# Patient Record
Sex: Male | Born: 1945 | Race: White | Hispanic: No | Marital: Married | Smoking: Current some day smoker
Health system: Southern US, Community
[De-identification: ages and names within clinical notes are randomized; demographics above are authoritative.]

## PROBLEM LIST (undated history)

## (undated) DIAGNOSIS — I998 Other disorder of circulatory system: Secondary | ICD-10-CM

## (undated) DIAGNOSIS — I2510 Atherosclerotic heart disease of native coronary artery without angina pectoris: Secondary | ICD-10-CM

## (undated) DIAGNOSIS — E7800 Pure hypercholesterolemia, unspecified: Secondary | ICD-10-CM

## (undated) DIAGNOSIS — E119 Type 2 diabetes mellitus without complications: Secondary | ICD-10-CM

## (undated) DIAGNOSIS — G540 Brachial plexus disorders: Secondary | ICD-10-CM

## (undated) DIAGNOSIS — D126 Benign neoplasm of colon, unspecified: Secondary | ICD-10-CM

## (undated) DIAGNOSIS — R918 Other nonspecific abnormal finding of lung field: Secondary | ICD-10-CM

## (undated) DIAGNOSIS — I252 Old myocardial infarction: Secondary | ICD-10-CM

## (undated) DIAGNOSIS — G2581 Restless legs syndrome: Secondary | ICD-10-CM

## (undated) DIAGNOSIS — D099 Carcinoma in situ, unspecified: Secondary | ICD-10-CM

## (undated) DIAGNOSIS — I10 Essential (primary) hypertension: Secondary | ICD-10-CM

## (undated) DIAGNOSIS — E785 Hyperlipidemia, unspecified: Secondary | ICD-10-CM

## (undated) DIAGNOSIS — K259 Gastric ulcer, unspecified as acute or chronic, without hemorrhage or perforation: Secondary | ICD-10-CM

## (undated) DIAGNOSIS — C61 Malignant neoplasm of prostate: Secondary | ICD-10-CM

## (undated) DIAGNOSIS — I219 Acute myocardial infarction, unspecified: Secondary | ICD-10-CM

## (undated) DIAGNOSIS — I700 Atherosclerosis of aorta: Secondary | ICD-10-CM

## (undated) DIAGNOSIS — J8410 Pulmonary fibrosis, unspecified: Secondary | ICD-10-CM

## (undated) DIAGNOSIS — K635 Polyp of colon: Secondary | ICD-10-CM

## (undated) DIAGNOSIS — F17200 Nicotine dependence, unspecified, uncomplicated: Secondary | ICD-10-CM

## (undated) DIAGNOSIS — J449 Chronic obstructive pulmonary disease, unspecified: Secondary | ICD-10-CM

## (undated) DIAGNOSIS — Z87891 Personal history of nicotine dependence: Secondary | ICD-10-CM

## (undated) DIAGNOSIS — Z9289 Personal history of other medical treatment: Secondary | ICD-10-CM

## (undated) DIAGNOSIS — J309 Allergic rhinitis, unspecified: Secondary | ICD-10-CM

## (undated) DIAGNOSIS — I6523 Occlusion and stenosis of bilateral carotid arteries: Secondary | ICD-10-CM

## (undated) DIAGNOSIS — R972 Elevated prostate specific antigen [PSA]: Secondary | ICD-10-CM

## (undated) DIAGNOSIS — N320 Bladder-neck obstruction: Secondary | ICD-10-CM

## (undated) DIAGNOSIS — I6529 Occlusion and stenosis of unspecified carotid artery: Secondary | ICD-10-CM

## (undated) DIAGNOSIS — C689 Malignant neoplasm of urinary organ, unspecified: Secondary | ICD-10-CM

## (undated) HISTORY — DX: Malignant neoplasm of urinary organ, unspecified: C68.9

## (undated) HISTORY — DX: Personal history of other medical treatment: Z92.89

## (undated) HISTORY — DX: Atherosclerosis of aorta: I70.0

## (undated) HISTORY — DX: Hyperlipidemia, unspecified: E78.5

## (undated) HISTORY — PX: CORONARY ANGIOPLASTY: SHX604

## (undated) HISTORY — DX: Personal history of nicotine dependence: Z87.891

## (undated) HISTORY — PX: CAROTID ENDARTERECTOMY: SUR193

## (undated) HISTORY — DX: Other nonspecific abnormal finding of lung field: R91.8

---
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
## 1997-12-20 HISTORY — PX: CORONARY ANGIOPLASTY: SHX604

## 2006-06-27 ENCOUNTER — Emergency Department: Payer: Self-pay | Admitting: Emergency Medicine

## 2007-11-22 ENCOUNTER — Ambulatory Visit: Payer: Self-pay | Admitting: Unknown Physician Specialty

## 2008-04-01 ENCOUNTER — Ambulatory Visit: Payer: Self-pay | Admitting: Unknown Physician Specialty

## 2008-08-21 ENCOUNTER — Ambulatory Visit: Payer: Self-pay | Admitting: Surgery

## 2008-09-03 ENCOUNTER — Ambulatory Visit: Payer: Self-pay | Admitting: Surgery

## 2008-11-01 ENCOUNTER — Ambulatory Visit: Payer: Self-pay | Admitting: Vascular Surgery

## 2008-11-05 ENCOUNTER — Inpatient Hospital Stay: Payer: Self-pay | Admitting: Vascular Surgery

## 2010-05-07 ENCOUNTER — Ambulatory Visit: Payer: Self-pay | Admitting: Family Medicine

## 2012-12-20 HISTORY — PX: CATARACT EXTRACTION: SUR2

## 2012-12-20 HISTORY — PX: COLONOSCOPY: SHX174

## 2013-05-07 ENCOUNTER — Ambulatory Visit: Payer: Self-pay | Admitting: Unknown Physician Specialty

## 2013-05-09 LAB — PATHOLOGY REPORT

## 2013-11-07 ENCOUNTER — Ambulatory Visit: Payer: Self-pay | Admitting: Ophthalmology

## 2013-11-07 DIAGNOSIS — I10 Essential (primary) hypertension: Secondary | ICD-10-CM

## 2013-11-19 ENCOUNTER — Ambulatory Visit: Payer: Self-pay | Admitting: Ophthalmology

## 2014-03-11 ENCOUNTER — Ambulatory Visit: Payer: Self-pay | Admitting: Ophthalmology

## 2014-06-03 DIAGNOSIS — R972 Elevated prostate specific antigen [PSA]: Secondary | ICD-10-CM | POA: Insufficient documentation

## 2014-06-03 DIAGNOSIS — F17200 Nicotine dependence, unspecified, uncomplicated: Secondary | ICD-10-CM | POA: Insufficient documentation

## 2014-06-03 DIAGNOSIS — E785 Hyperlipidemia, unspecified: Secondary | ICD-10-CM | POA: Insufficient documentation

## 2014-06-03 DIAGNOSIS — I2510 Atherosclerotic heart disease of native coronary artery without angina pectoris: Secondary | ICD-10-CM | POA: Insufficient documentation

## 2014-09-09 DIAGNOSIS — E119 Type 2 diabetes mellitus without complications: Secondary | ICD-10-CM | POA: Insufficient documentation

## 2015-02-17 DIAGNOSIS — E785 Hyperlipidemia, unspecified: Secondary | ICD-10-CM | POA: Diagnosis not present

## 2015-02-17 DIAGNOSIS — I6529 Occlusion and stenosis of unspecified carotid artery: Secondary | ICD-10-CM | POA: Diagnosis not present

## 2015-02-17 DIAGNOSIS — I10 Essential (primary) hypertension: Secondary | ICD-10-CM | POA: Diagnosis not present

## 2015-02-17 DIAGNOSIS — M1990 Unspecified osteoarthritis, unspecified site: Secondary | ICD-10-CM | POA: Diagnosis not present

## 2015-03-03 DIAGNOSIS — E119 Type 2 diabetes mellitus without complications: Secondary | ICD-10-CM | POA: Diagnosis not present

## 2015-03-03 DIAGNOSIS — Z79899 Other long term (current) drug therapy: Secondary | ICD-10-CM | POA: Diagnosis not present

## 2015-03-03 DIAGNOSIS — E785 Hyperlipidemia, unspecified: Secondary | ICD-10-CM | POA: Diagnosis not present

## 2015-03-03 DIAGNOSIS — Z125 Encounter for screening for malignant neoplasm of prostate: Secondary | ICD-10-CM | POA: Diagnosis not present

## 2015-03-10 DIAGNOSIS — I10 Essential (primary) hypertension: Secondary | ICD-10-CM | POA: Diagnosis not present

## 2015-03-10 DIAGNOSIS — E780 Pure hypercholesterolemia: Secondary | ICD-10-CM | POA: Diagnosis not present

## 2015-03-10 DIAGNOSIS — E119 Type 2 diabetes mellitus without complications: Secondary | ICD-10-CM | POA: Diagnosis not present

## 2015-03-10 DIAGNOSIS — I2510 Atherosclerotic heart disease of native coronary artery without angina pectoris: Secondary | ICD-10-CM | POA: Diagnosis not present

## 2015-03-11 DIAGNOSIS — H47022 Hemorrhage in optic nerve sheath, left eye: Secondary | ICD-10-CM | POA: Diagnosis not present

## 2015-03-11 DIAGNOSIS — I2510 Atherosclerotic heart disease of native coronary artery without angina pectoris: Secondary | ICD-10-CM | POA: Diagnosis not present

## 2015-03-13 DIAGNOSIS — I214 Non-ST elevation (NSTEMI) myocardial infarction: Secondary | ICD-10-CM | POA: Diagnosis not present

## 2015-03-13 DIAGNOSIS — I10 Essential (primary) hypertension: Secondary | ICD-10-CM | POA: Diagnosis not present

## 2015-03-13 DIAGNOSIS — E119 Type 2 diabetes mellitus without complications: Secondary | ICD-10-CM | POA: Diagnosis not present

## 2015-03-13 DIAGNOSIS — I2510 Atherosclerotic heart disease of native coronary artery without angina pectoris: Secondary | ICD-10-CM | POA: Diagnosis not present

## 2015-04-11 NOTE — Op Note (Signed)
PATIENT NAME:  Seth Rivera, Seth Rivera MR#:  [id number] DATE OF BIRTH:  [birth year]  DATE OF PROCEDURE:  11/19/2013  PREOPERATIVE DIAGNOSIS:  Cataract, left eye.    POSTOPERATIVE DIAGNOSIS:  Cataract, left eye.  PROCEDURE PERFORMED:  Extracapsular cataract extraction using phacoemulsification with placement of an Alcon SN6CWF, 24.0-diopter posterior chamber lens, serial # [id number].  SURGEON:  Loura Back. Haniya Fern, MD  ASSISTANT:  None.  ANESTHESIA:  Was 4% lidocaine and 0.75% Marcaine in a 50:50 mixture 10 units/mL of Hylenex added, given as a peribulbar.   ANESTHESIOLOGIST: Dr. Boston Service.   COMPLICATIONS:  None.  ESTIMATED BLOOD LOSS:  Less than 1 ml.  DESCRIPTION OF PROCEDURE:  The patient was brought to the operating room and given a peribulbar block.  The patient was then prepped and draped in the usual fashion.  The vertical rectus muscles were imbricated using 5-0 silk sutures.  These sutures were then clamped to the sterile drapes as bridle sutures.  A limbal peritomy was performed extending two clock hours and hemostasis was obtained with cautery.  A partial thickness scleral groove was made at the surgical limbus and dissected anteriorly in a lamellar dissection using an Alcon crescent knife.  The anterior chamber was entered supero-temporally with a Superblade and through the lamellar dissection with a 2.6 mm keratome.  DisCoVisc was used to replace the aqueous and a continuous tear capsulorrhexis was carried out.  Hydrodissection and hydrodelineation were carried out with balanced salt and a 27 gauge canula.  The nucleus was rotated to confirm the effectiveness of the hydrodissection.  Phacoemulsification was carried out using a divide-and-conquer technique.  Total ultrasound time was 1 minute and 15 seconds with an average power of 23%. CDE 29.95. No sutures placed.   Irrigation/aspiration was used to remove the residual cortex.  DisCoVisc was used to inflate the capsule and the  internal incision was enlarged to 3 mm with the crescent knife.  The intraocular lens was folded and inserted into the capsular bag using the An AcrySert delivery system was used.  Irrigation/aspiration was used to remove the residual DisCoVisc.  Miostat was injected into the anterior chamber through the paracentesis track to inflate the anterior chamber and induce miosis.  The wound was checked for leaks and none were found. The conjunctiva was closed with cautery and the bridle sutures were removed.  Two drops of 0.3% Vigamox were placed on the eye.   An eye shield was placed on the eye.  No injectable antibiotics were used in this case.   The patient was discharged to the recovery room in good condition.    ____________________________ Loura Back Merrell Borsuk, MD sad:np D: 11/19/2013 13:29:48 ET T: 11/19/2013 15:54:49 ET JOB#: [id number]  cc: Remo Lipps A. Deiondra Denley, MD, <Dictator> Martie Lee MD ELECTRONICALLY SIGNED 11/26/2013 8:34

## 2015-04-12 NOTE — Op Note (Signed)
PATIENT NAME:  Seth Rivera, Seth Rivera MR#:  [id number] DATE OF BIRTH:  1946/12/15  DATE OF PROCEDURE:  03/11/2014  PREOPERATIVE DIAGNOSIS: Cataract, right eye.   POSTOPERATIVE DIAGNOSIS: Cataract, right eye.   PROCEDURE PERFORMED: Extracapsular cataract extraction using phacoemulsification with placement of Alcon SN6CWS, 23.5-diopter posterior chamber lens, serial number [id number].   SURGEON: Loura Back. Elwyn Lowden, MD   ANESTHESIA: 4% lidocaine and 0.75% Marcaine in a 50-50 mixture with 10 units/mL of Hylenex added, given as a peribulbar.   ANESTHESIOLOGIST: Gjibertus F. Boston Service, MD  COMPLICATIONS: None.   ESTIMATED BLOOD LOSS: Less than 1 mL.   DESCRIPTION OF PROCEDURE:  The patient was brought to the operating room and given a peribulbar block.  The patient was then prepped and draped in the usual fashion.  The vertical rectus muscles were imbricated using 5-0 silk sutures.  These sutures were then clamped to the sterile drapes as bridle sutures.  A limbal peritomy was performed extending two clock hours and hemostasis was obtained with cautery.  A partial thickness scleral groove was made at the surgical limbus and dissected anteriorly in a lamellar dissection using an Alcon crescent knife.  The anterior chamber was entered superonasally with a Superblade and through the lamellar dissection with a 2.6 mm keratome.  DisCoVisc was used to replace the aqueous and a continuous tear capsulorrhexis was carried out.  Hydrodissection and hydrodelineation were carried out with balanced salt and a 27 gauge canula.  The nucleus was rotated to confirm the effectiveness of the hydrodissection.  Phacoemulsification was carried out using a divide-and-conquer technique.  Total ultrasound time was 59.8 seconds with an average power of 18.5%, CDE of 18.41.   Irrigation/aspiration was used to remove the residual cortex.  DisCoVisc was used to inflate the capsule and the internal incision was enlarged to 3 mm  with the crescent knife.  The intraocular lens was folded and inserted into the capsular bag using the Alcon AcrySert delivery system.  Irrigation/aspiration was used to remove the residual DisCoVisc.  Miostat was injected into the anterior chamber through the paracentesis track to inflate the anterior chamber and induce miosis.  The wound was checked for leaks and none were found. The conjunctiva was closed with cautery and the bridle sutures were removed.  Two drops of 0.3% Vigamox were placed on the eye.   An eye shield was placed on the eye.  The patient was discharged to the recovery room in good condition.  NOTE: No intraocular antibiotics were used due to patient allergies.   ____________________________ Loura Back Mikinzie Maciejewski, MD sad:jcm D: 03/11/2014 13:50:52 ET T: 03/11/2014 16:46:43 ET JOB#: [id number]  cc: Remo Lipps A. Thanvi Blincoe, MD, <Dictator> Martie Lee MD ELECTRONICALLY SIGNED 03/18/2014 13:28

## 2015-04-24 DIAGNOSIS — J439 Emphysema, unspecified: Secondary | ICD-10-CM | POA: Diagnosis not present

## 2015-04-24 DIAGNOSIS — G2581 Restless legs syndrome: Secondary | ICD-10-CM | POA: Diagnosis not present

## 2015-09-08 DIAGNOSIS — I10 Essential (primary) hypertension: Secondary | ICD-10-CM | POA: Diagnosis not present

## 2015-09-08 DIAGNOSIS — E780 Pure hypercholesterolemia: Secondary | ICD-10-CM | POA: Diagnosis not present

## 2015-09-08 DIAGNOSIS — I2510 Atherosclerotic heart disease of native coronary artery without angina pectoris: Secondary | ICD-10-CM | POA: Diagnosis not present

## 2015-09-08 DIAGNOSIS — I214 Non-ST elevation (NSTEMI) myocardial infarction: Secondary | ICD-10-CM | POA: Diagnosis not present

## 2015-11-27 DIAGNOSIS — J432 Centrilobular emphysema: Secondary | ICD-10-CM | POA: Diagnosis not present

## 2016-02-19 DIAGNOSIS — I6529 Occlusion and stenosis of unspecified carotid artery: Secondary | ICD-10-CM | POA: Diagnosis not present

## 2016-02-19 DIAGNOSIS — I2510 Atherosclerotic heart disease of native coronary artery without angina pectoris: Secondary | ICD-10-CM | POA: Diagnosis not present

## 2016-02-19 DIAGNOSIS — I6523 Occlusion and stenosis of bilateral carotid arteries: Secondary | ICD-10-CM | POA: Diagnosis not present

## 2016-02-19 DIAGNOSIS — E785 Hyperlipidemia, unspecified: Secondary | ICD-10-CM | POA: Diagnosis not present

## 2016-02-19 DIAGNOSIS — M1990 Unspecified osteoarthritis, unspecified site: Secondary | ICD-10-CM | POA: Diagnosis not present

## 2016-02-19 DIAGNOSIS — I10 Essential (primary) hypertension: Secondary | ICD-10-CM | POA: Diagnosis not present

## 2016-03-09 DIAGNOSIS — J410 Simple chronic bronchitis: Secondary | ICD-10-CM | POA: Diagnosis not present

## 2016-03-09 DIAGNOSIS — I10 Essential (primary) hypertension: Secondary | ICD-10-CM | POA: Diagnosis not present

## 2016-03-09 DIAGNOSIS — E7800 Pure hypercholesterolemia, unspecified: Secondary | ICD-10-CM | POA: Diagnosis not present

## 2016-03-09 DIAGNOSIS — E119 Type 2 diabetes mellitus without complications: Secondary | ICD-10-CM | POA: Diagnosis not present

## 2016-03-09 DIAGNOSIS — I2510 Atherosclerotic heart disease of native coronary artery without angina pectoris: Secondary | ICD-10-CM | POA: Diagnosis not present

## 2016-04-21 DIAGNOSIS — R7309 Other abnormal glucose: Secondary | ICD-10-CM | POA: Diagnosis not present

## 2016-04-21 DIAGNOSIS — I259 Chronic ischemic heart disease, unspecified: Secondary | ICD-10-CM | POA: Diagnosis not present

## 2016-04-21 DIAGNOSIS — I10 Essential (primary) hypertension: Secondary | ICD-10-CM | POA: Diagnosis not present

## 2016-04-21 DIAGNOSIS — E785 Hyperlipidemia, unspecified: Secondary | ICD-10-CM | POA: Diagnosis not present

## 2016-04-23 DIAGNOSIS — I259 Chronic ischemic heart disease, unspecified: Secondary | ICD-10-CM | POA: Diagnosis not present

## 2016-04-23 DIAGNOSIS — E785 Hyperlipidemia, unspecified: Secondary | ICD-10-CM | POA: Diagnosis not present

## 2016-04-23 DIAGNOSIS — I10 Essential (primary) hypertension: Secondary | ICD-10-CM | POA: Diagnosis not present

## 2016-05-13 ENCOUNTER — Telehealth: Payer: Self-pay | Admitting: *Deleted

## 2016-05-13 NOTE — Telephone Encounter (Signed)
Received referral for initial lung cancer screening scan. Contacted patient and obtained smoking history as well as answering questions related to screening process. Patient is tentatively scheduled for shared decision making visit and CT scan on 05/21/16 at 2pm, pending insurance approval from business office.

## 2016-05-19 ENCOUNTER — Telehealth: Payer: Self-pay | Admitting: *Deleted

## 2016-05-19 NOTE — Telephone Encounter (Signed)
Received referral for initial lung cancer screening scan. Contacted patient and obtained smoking history, (former smoker, quit 01/21/2116, 50 pack year history),as well as answering questions related to screening process. Patient denies symptoms of lung cancer such as hemoptysis or weight loss and reports he is willing and able to undergo curative treatment for lung cancer if found. Patient is tentatively scheduled for shared decision making visit and CT scan on 05/21/16, pending insurance approval from business office.

## 2016-05-20 ENCOUNTER — Other Ambulatory Visit: Payer: Self-pay | Admitting: Family Medicine

## 2016-05-20 ENCOUNTER — Telehealth: Payer: Self-pay | Admitting: *Deleted

## 2016-05-20 ENCOUNTER — Encounter: Payer: Self-pay | Admitting: Family Medicine

## 2016-05-20 DIAGNOSIS — Z87891 Personal history of nicotine dependence: Secondary | ICD-10-CM

## 2016-05-20 HISTORY — DX: Personal history of nicotine dependence: Z87.891

## 2016-05-20 NOTE — Telephone Encounter (Signed)
Patient reports that he does not want lung cancer screening scan at this time. He would like to wait until after his scheduled pulmonology appointment.

## 2016-05-21 ENCOUNTER — Inpatient Hospital Stay: Payer: Commercial Managed Care - HMO | Admitting: Family Medicine

## 2016-05-21 ENCOUNTER — Ambulatory Visit: Payer: Commercial Managed Care - HMO

## 2016-07-09 DIAGNOSIS — D126 Benign neoplasm of colon, unspecified: Secondary | ICD-10-CM | POA: Diagnosis not present

## 2016-07-13 ENCOUNTER — Telehealth: Payer: Self-pay | Admitting: *Deleted

## 2016-07-13 NOTE — Telephone Encounter (Signed)
Received referral for low dose CT lung cancer screening. Despite multiple attempts at all contact numbers available, have not been able to arrange for shared decision making visit and CT scan. Letter mailed to patient in final attempt to contact patient. I will be happy to assist in the future if patient so desires. Will forward to referring provider.  

## 2016-07-27 ENCOUNTER — Other Ambulatory Visit: Payer: Self-pay | Admitting: Specialist

## 2016-07-27 DIAGNOSIS — R0602 Shortness of breath: Secondary | ICD-10-CM

## 2016-07-27 DIAGNOSIS — J439 Emphysema, unspecified: Secondary | ICD-10-CM | POA: Diagnosis not present

## 2016-07-30 ENCOUNTER — Ambulatory Visit: Admission: RE | Admit: 2016-07-30 | Payer: Commercial Managed Care - HMO | Source: Ambulatory Visit

## 2016-08-03 ENCOUNTER — Ambulatory Visit
Admission: RE | Admit: 2016-08-03 | Discharge: 2016-08-03 | Disposition: A | Discharge status: Home / Self Care | Payer: Commercial Managed Care - HMO | Source: Ambulatory Visit | Attending: Specialist | Admitting: Specialist

## 2016-08-03 DIAGNOSIS — I2510 Atherosclerotic heart disease of native coronary artery without angina pectoris: Secondary | ICD-10-CM | POA: Insufficient documentation

## 2016-08-03 DIAGNOSIS — I700 Atherosclerosis of aorta: Secondary | ICD-10-CM | POA: Diagnosis not present

## 2016-08-03 DIAGNOSIS — R0602 Shortness of breath: Secondary | ICD-10-CM | POA: Diagnosis present

## 2016-08-03 DIAGNOSIS — J439 Emphysema, unspecified: Secondary | ICD-10-CM | POA: Diagnosis not present

## 2016-08-20 DIAGNOSIS — I10 Essential (primary) hypertension: Secondary | ICD-10-CM | POA: Diagnosis not present

## 2016-08-20 DIAGNOSIS — E785 Hyperlipidemia, unspecified: Secondary | ICD-10-CM | POA: Diagnosis not present

## 2016-08-30 DIAGNOSIS — R7309 Other abnormal glucose: Secondary | ICD-10-CM | POA: Diagnosis not present

## 2016-08-30 DIAGNOSIS — E785 Hyperlipidemia, unspecified: Secondary | ICD-10-CM | POA: Diagnosis not present

## 2016-08-30 DIAGNOSIS — I10 Essential (primary) hypertension: Secondary | ICD-10-CM | POA: Diagnosis not present

## 2016-09-03 ENCOUNTER — Encounter: Payer: Self-pay | Admitting: *Deleted

## 2016-09-06 ENCOUNTER — Ambulatory Visit: Payer: Commercial Managed Care - HMO | Admitting: Anesthesiology

## 2016-09-06 ENCOUNTER — Ambulatory Visit
Admission: RE | Admit: 2016-09-06 | Discharge: 2016-09-06 | Disposition: A | Discharge status: Home / Self Care | Payer: Commercial Managed Care - HMO | Source: Ambulatory Visit | Attending: Unknown Physician Specialty | Admitting: Unknown Physician Specialty

## 2016-09-06 ENCOUNTER — Encounter
Admission: RE | Disposition: A | Discharge status: Home / Self Care | Payer: Self-pay | Source: Ambulatory Visit | Attending: Unknown Physician Specialty

## 2016-09-06 DIAGNOSIS — I2510 Atherosclerotic heart disease of native coronary artery without angina pectoris: Secondary | ICD-10-CM | POA: Diagnosis not present

## 2016-09-06 DIAGNOSIS — J8410 Pulmonary fibrosis, unspecified: Secondary | ICD-10-CM | POA: Insufficient documentation

## 2016-09-06 DIAGNOSIS — I6529 Occlusion and stenosis of unspecified carotid artery: Secondary | ICD-10-CM | POA: Insufficient documentation

## 2016-09-06 DIAGNOSIS — G2581 Restless legs syndrome: Secondary | ICD-10-CM | POA: Insufficient documentation

## 2016-09-06 DIAGNOSIS — K635 Polyp of colon: Secondary | ICD-10-CM | POA: Diagnosis not present

## 2016-09-06 DIAGNOSIS — E785 Hyperlipidemia, unspecified: Secondary | ICD-10-CM | POA: Diagnosis not present

## 2016-09-06 DIAGNOSIS — D124 Benign neoplasm of descending colon: Secondary | ICD-10-CM | POA: Diagnosis not present

## 2016-09-06 DIAGNOSIS — K5790 Diverticulosis of intestine, part unspecified, without perforation or abscess without bleeding: Secondary | ICD-10-CM | POA: Diagnosis not present

## 2016-09-06 DIAGNOSIS — Z87891 Personal history of nicotine dependence: Secondary | ICD-10-CM | POA: Diagnosis not present

## 2016-09-06 DIAGNOSIS — Z859 Personal history of malignant neoplasm, unspecified: Secondary | ICD-10-CM | POA: Diagnosis not present

## 2016-09-06 DIAGNOSIS — Z7982 Long term (current) use of aspirin: Secondary | ICD-10-CM | POA: Insufficient documentation

## 2016-09-06 DIAGNOSIS — J449 Chronic obstructive pulmonary disease, unspecified: Secondary | ICD-10-CM | POA: Insufficient documentation

## 2016-09-06 DIAGNOSIS — Z86010 Personal history of colonic polyps: Secondary | ICD-10-CM | POA: Diagnosis not present

## 2016-09-06 DIAGNOSIS — I10 Essential (primary) hypertension: Secondary | ICD-10-CM | POA: Diagnosis not present

## 2016-09-06 DIAGNOSIS — Z79899 Other long term (current) drug therapy: Secondary | ICD-10-CM | POA: Insufficient documentation

## 2016-09-06 DIAGNOSIS — I252 Old myocardial infarction: Secondary | ICD-10-CM | POA: Insufficient documentation

## 2016-09-06 DIAGNOSIS — D123 Benign neoplasm of transverse colon: Secondary | ICD-10-CM | POA: Diagnosis not present

## 2016-09-06 DIAGNOSIS — E119 Type 2 diabetes mellitus without complications: Secondary | ICD-10-CM | POA: Diagnosis not present

## 2016-09-06 DIAGNOSIS — Z1211 Encounter for screening for malignant neoplasm of colon: Secondary | ICD-10-CM | POA: Diagnosis not present

## 2016-09-06 HISTORY — DX: Essential (primary) hypertension: I10

## 2016-09-06 HISTORY — DX: Acute myocardial infarction, unspecified: I21.9

## 2016-09-06 HISTORY — DX: Benign neoplasm of colon, unspecified: D12.6

## 2016-09-06 HISTORY — DX: Pulmonary fibrosis, unspecified: J84.10

## 2016-09-06 HISTORY — DX: Chronic obstructive pulmonary disease, unspecified: J44.9

## 2016-09-06 HISTORY — DX: Atherosclerotic heart disease of native coronary artery without angina pectoris: I25.10

## 2016-09-06 HISTORY — DX: Hyperlipidemia, unspecified: E78.5

## 2016-09-06 HISTORY — DX: Type 2 diabetes mellitus without complications: E11.9

## 2016-09-06 HISTORY — DX: Restless legs syndrome: G25.81

## 2016-09-06 HISTORY — PX: COLONOSCOPY WITH PROPOFOL: SHX5780

## 2016-09-06 HISTORY — DX: Occlusion and stenosis of unspecified carotid artery: I65.29

## 2016-09-06 LAB — GLUCOSE, CAPILLARY: GLUCOSE-CAPILLARY: 113 mg/dL — AB (ref 65–99)

## 2016-09-06 SURGERY — COLONOSCOPY WITH PROPOFOL
Anesthesia: General

## 2016-09-06 MED ORDER — LIDOCAINE HCL (PF) 2 % IJ SOLN
INTRAMUSCULAR | Status: DC | PRN
  Administered 2016-09-06: 50 mg

## 2016-09-06 MED ORDER — PROPOFOL 10 MG/ML IV BOLUS
INTRAVENOUS | Status: DC | PRN
  Administered 2016-09-06: 30 mg via INTRAVENOUS
  Administered 2016-09-06: 20 mg via INTRAVENOUS

## 2016-09-06 MED ORDER — MIDAZOLAM HCL 5 MG/5ML IJ SOLN
INTRAMUSCULAR | Status: DC | PRN
  Administered 2016-09-06: 1 mg via INTRAVENOUS

## 2016-09-06 MED ORDER — PROPOFOL 500 MG/50ML IV EMUL
INTRAVENOUS | Status: DC | PRN
  Administered 2016-09-06: 100 ug/kg/min via INTRAVENOUS

## 2016-09-06 MED ORDER — PHENYLEPHRINE HCL 10 MG/ML IJ SOLN
INTRAMUSCULAR | Status: DC | PRN
  Administered 2016-09-06 (×3): 100 ug via INTRAVENOUS

## 2016-09-06 MED ORDER — SODIUM CHLORIDE 0.9 % IV SOLN
INTRAVENOUS | Status: DC
  Administered 2016-09-06: 09:00:00 via INTRAVENOUS

## 2016-09-06 MED ORDER — FENTANYL CITRATE (PF) 100 MCG/2ML IJ SOLN
INTRAMUSCULAR | Status: DC | PRN
  Administered 2016-09-06: 50 ug via INTRAVENOUS

## 2016-09-06 MED ORDER — SODIUM CHLORIDE 0.9 % IV SOLN: INTRAVENOUS | Status: DC

## 2016-09-06 NOTE — H&P (Signed)
   Primary Care Physician:  Albina Billet, MD Primary Gastroenterologist:  Dr. Vira Agar  Pre-Procedure History & Physical: HPI:  Seth Rivera is a 70 y.o. male is here for an colonoscopy.   Past Medical History:  Diagnosis Date  . Carotid stenosis   . COPD (chronic obstructive pulmonary disease) ([place])   . Coronary artery disease   . Diabetes mellitus without complication ([place])   . Hyperlipidemia   . Hypertension   . Myocardial infarction ([place])   . Personal history of tobacco use, presenting hazards to health 05/20/2016  . 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 Restless leg syndrome   . Squamous cell carcinoma ([place])   . Tubular adenoma of colon     Past Surgical History:  Procedure Laterality Date  . CAROTID ENDARTERECTOMY    . CORONARY ANGIOPLASTY    . CORONARY ANGIOPLASTY      Prior to Admission medications   Medication Sig Start Date End Date Taking? Authorizing Provider  aspirin EC 81 MG tablet Take 81 mg by mouth daily.   Yes Historical Provider, MD  atenolol (TENORMIN) 50 MG tablet Take 50 mg by mouth daily.   Yes Historical Provider, MD  atorvastatin (LIPITOR) 40 MG tablet Take 40 mg by mouth daily.   Yes Historical Provider, MD  vitamin B-12 (CYANOCOBALAMIN) 100 MCG tablet Take 100 mcg by mouth daily.   Yes Historical Provider, MD    Allergies as of 07/29/2016  . (Not on File)    History reviewed. No pertinent family history.  Social History   Social History  . Marital status: Married    Spouse name: N/A  . Number of children: N/A  . Years of education: N/A   Occupational History  . Not on file.   Social History Main Topics  . Smoking status: Former Research scientist (life sciences)  . Smokeless tobacco: Never Used  . Alcohol use Not on file  . Drug use: Unknown  . Sexual activity: Not on file   Other Topics Concern  . Not on file   Social History Narrative  . No narrative on file    Review of Systems: See HPI, otherwise negative ROS  Physical Exam: BP (!) 158/95   Pulse 71    Temp 97.6 F (36.4 C) (Tympanic)   Resp (!) 75   Ht 5\' 10"  (1.778 m)   Wt 88.5 kg (195 lb)   SpO2 100%   BMI 27.98 kg/m  General:   Alert,  pleasant and cooperative in NAD Head:  Normocephalic and atraumatic. Neck:  Supple; no masses or thyromegaly. Lungs:  Clear throughout to auscultation.    Heart:  Regular rate and rhythm. Abdomen:  Soft, nontender and nondistended. Normal bowel sounds, without guarding, and without rebound.   Neurologic:  Alert and  oriented x4;  grossly normal neurologically.  Impression/Plan: DISHON GLYMPH is here for an colonoscopy to be performed for personal history of colon polyps.  Risks, benefits, limitations, and alternatives regarding  colonoscopy have been reviewed with the patient.  Questions have been answered.  All parties agreeable.   Gaylyn Cheers, MD  09/06/2016, 9:29 AM

## 2016-09-06 NOTE — Op Note (Signed)
[facility] Patient Name: Seth Rivera Procedure Date: 09/06/2016 9:32 AM MRN: [id number] Account #: [id number] Date of Birth: 11/19/1946 Admit Type: Outpatient Age: 70 Room: [facility] ENDO ROOM 4 Gender: Male Note Status: Finalized Procedure:            Colonoscopy Indications:          High risk colon cancer surveillance: Personal history                        of colonic polyps Providers:            Manya Silvas, MD Referring MD:         Leona Carry. Hall Busing, MD (Referring MD) Medicines:            Propofol per Anesthesia Complications:        No immediate complications. Procedure:            Pre-Anesthesia Assessment:                       - After reviewing the risks and benefits, the patient                        was deemed in satisfactory condition to undergo the                        procedure.                       After obtaining informed consent, the colonoscope was                        passed under direct vision. Throughout the procedure,                        the patient's blood pressure, pulse, and oxygen                        saturations were monitored continuously. The                        Colonoscope was introduced through the anus and                        advanced to the the cecum, identified by appendiceal                        orifice and ileocecal valve. The colonoscopy was                        performed without difficulty. The patient tolerated the                        procedure well. The quality of the bowel preparation                        was excellent. Findings:      Four sessile polyps were found in the splenic flexure and transverse       colon. The polyps were diminutive in size. These polyps were removed       with a jumbo cold forceps. Resection and retrieval were complete.  A small polyp was found in the descending colon. The polyp was sessile.       The polyp was removed with a hot snare. Resection  and retrieval were       complete.      The exam was otherwise without abnormality. Impression:           - Four diminutive polyps at the splenic flexure and in                        the transverse colon, removed with a jumbo cold                        forceps. Resected and retrieved.                       - One small polyp in the descending colon, removed with                        a hot snare. Resected and retrieved.                       - The examination was otherwise normal. Recommendation:       - Await pathology results. Manya Silvas, MD 09/06/2016 10:07:28 AM This report has been signed electronically. Number of Addenda: 0 Note Initiated On: 09/06/2016 9:32 AM Scope Withdrawal Time: 0 hours 22 minutes 36 seconds  Total Procedure Duration: 0 hours 27 minutes 4 seconds       [facility]

## 2016-09-06 NOTE — Transfer of Care (Signed)
Immediate Anesthesia Transfer of Care Note  Patient: Seth Rivera  Procedure(s) Performed: Procedure(s): COLONOSCOPY WITH PROPOFOL (N/A)  Patient Location: PACU  Anesthesia Type:General  Level of Consciousness: sedated  Airway & Oxygen Therapy: Patient Spontanous Breathing and Patient connected to nasal cannula oxygen  Post-op Assessment: Report given to RN and Post -op Vital signs reviewed and stable  Post vital signs: Reviewed and stable  Last Vitals:  Vitals:   09/06/16 0909 09/06/16 1011  BP: (!) 158/95 105/63  Pulse: 71 63  Resp: (!) 75 17  Temp: 36.4 C (!) 35.8 C    Last Pain:  Vitals:   09/06/16 1011  TempSrc: Tympanic         Complications: No apparent anesthesia complications

## 2016-09-06 NOTE — Anesthesia Postprocedure Evaluation (Signed)
Anesthesia Post Note  Patient: Seth Rivera  Procedure(s) Performed: Procedure(s) (LRB): COLONOSCOPY WITH PROPOFOL (N/A)  Patient location during evaluation: PACU Anesthesia Type: General Level of consciousness: awake Pain management: pain level controlled Vital Signs Assessment: post-procedure vital signs reviewed and stable Respiratory status: patient connected to nasal cannula oxygen and spontaneous breathing Cardiovascular status: stable Anesthetic complications: no    Last Vitals:  Vitals:   09/06/16 1021 09/06/16 1031  BP: (!) 142/67 (!) 162/79  Pulse: 60 64  Resp: 14 15  Temp:      Last Pain:  Vitals:   09/06/16 1011  TempSrc: Tympanic                 VAN STAVEREN,Larie Mathes

## 2016-09-06 NOTE — Anesthesia Preprocedure Evaluation (Signed)
Anesthesia Evaluation  Patient identified by MRN, date of birth, ID band Patient awake    Reviewed: Allergy & Precautions, NPO status , Patient's Chart, lab work & pertinent test results  Airway Mallampati: II       Dental  (+) Teeth Intact   Pulmonary COPD, former smoker,     + decreased breath sounds      Cardiovascular Exercise Tolerance: Good hypertension, Pt. on home beta blockers + CAD, + Past MI and + Peripheral Vascular Disease   Rhythm:Regular Rate:Normal     Neuro/Psych negative neurological ROS     GI/Hepatic negative GI ROS, Neg liver ROS,   Endo/Other  diabetes, Well Controlled, Type 2  Renal/GU negative Renal ROS     Musculoskeletal   Abdominal (+) + obese,   Peds negative pediatric ROS (+)  Hematology negative hematology ROS (+)   Anesthesia Other Findings   Reproductive/Obstetrics                             Anesthesia Physical Anesthesia Plan  ASA: III  Anesthesia Plan: General   Post-op Pain Management:    Induction: Intravenous  Airway Management Planned: Natural Airway and Nasal Cannula  Additional Equipment:   Intra-op Plan:   Post-operative Plan:   Informed Consent: I have reviewed the patients History and Physical, chart, labs and discussed the procedure including the risks, benefits and alternatives for the proposed anesthesia with the patient or authorized representative who has indicated his/her understanding and acceptance.     Plan Discussed with: CRNA  Anesthesia Plan Comments:         Anesthesia Quick Evaluation

## 2016-09-07 ENCOUNTER — Encounter: Payer: Self-pay | Admitting: Unknown Physician Specialty

## 2016-09-07 LAB — SURGICAL PATHOLOGY

## 2016-09-23 DIAGNOSIS — I10 Essential (primary) hypertension: Secondary | ICD-10-CM | POA: Diagnosis not present

## 2016-09-23 DIAGNOSIS — I2510 Atherosclerotic heart disease of native coronary artery without angina pectoris: Secondary | ICD-10-CM | POA: Diagnosis not present

## 2016-09-23 DIAGNOSIS — J410 Simple chronic bronchitis: Secondary | ICD-10-CM | POA: Diagnosis not present

## 2016-09-23 DIAGNOSIS — F17200 Nicotine dependence, unspecified, uncomplicated: Secondary | ICD-10-CM | POA: Diagnosis not present

## 2016-09-23 DIAGNOSIS — I214 Non-ST elevation (NSTEMI) myocardial infarction: Secondary | ICD-10-CM | POA: Diagnosis not present

## 2016-09-23 DIAGNOSIS — E7800 Pure hypercholesterolemia, unspecified: Secondary | ICD-10-CM | POA: Diagnosis not present

## 2017-02-07 DIAGNOSIS — J439 Emphysema, unspecified: Secondary | ICD-10-CM | POA: Diagnosis not present

## 2017-02-07 DIAGNOSIS — J449 Chronic obstructive pulmonary disease, unspecified: Secondary | ICD-10-CM | POA: Diagnosis not present

## 2017-02-24 ENCOUNTER — Encounter (INDEPENDENT_AMBULATORY_CARE_PROVIDER_SITE_OTHER): Payer: Self-pay

## 2017-02-24 ENCOUNTER — Ambulatory Visit (INDEPENDENT_AMBULATORY_CARE_PROVIDER_SITE_OTHER): Payer: Medicare HMO | Admitting: Vascular Surgery

## 2017-02-24 ENCOUNTER — Encounter (INDEPENDENT_AMBULATORY_CARE_PROVIDER_SITE_OTHER): Payer: Self-pay | Admitting: Vascular Surgery

## 2017-02-24 ENCOUNTER — Other Ambulatory Visit (INDEPENDENT_AMBULATORY_CARE_PROVIDER_SITE_OTHER): Payer: Self-pay | Admitting: Vascular Surgery

## 2017-02-24 ENCOUNTER — Other Ambulatory Visit (INDEPENDENT_AMBULATORY_CARE_PROVIDER_SITE_OTHER): Payer: Medicare HMO

## 2017-02-24 DIAGNOSIS — I10 Essential (primary) hypertension: Secondary | ICD-10-CM

## 2017-02-24 DIAGNOSIS — E7800 Pure hypercholesterolemia, unspecified: Secondary | ICD-10-CM | POA: Insufficient documentation

## 2017-02-24 DIAGNOSIS — I6529 Occlusion and stenosis of unspecified carotid artery: Secondary | ICD-10-CM | POA: Insufficient documentation

## 2017-02-24 DIAGNOSIS — I6523 Occlusion and stenosis of bilateral carotid arteries: Secondary | ICD-10-CM

## 2017-02-24 NOTE — Progress Notes (Signed)
MRN : [id number]  Seth Rivera is a 71 y.o. (1946/01/28) male who presents with chief complaint of  Chief Complaint  Patient presents with  . Follow-up  .  History of Present Illness: The patient is seen for follow up evaluation of carotid stenosis. The carotid stenosis followed by ultrasound.   The patient denies amaurosis fugax. There is no recent history of TIA symptoms or focal motor deficits. There is no prior documented CVA.  The patient is taking enteric-coated aspirin 81 mg daily.  There is no history of migraine headaches. There is no history of seizures.  The patient has a history of coronary artery disease, no recent episodes of angina or shortness of breath. The patient denies PAD or claudication symptoms. There is a history of hyperlipidemia which is being treated with a statin.    Carotid Duplex done today shows right patent CEA and left ICA 40-59%.  No change compared to last study in 02/19/2016 Current Meds  Medication Sig  . aspirin EC 81 MG tablet Take 81 mg by mouth daily.  Mar[place] Kitchen atenolol (TENORMIN) 50 MG tablet Take 50 mg by mouth daily.  Mar[place] Kitchen atorvastatin (LIPITOR) 40 MG tablet Take 40 mg by mouth daily.  . vitamin B-12 (CYANOCOBALAMIN) 100 MCG tablet Take 100 mcg by mouth daily.    Past Medical History:  Diagnosis Date  . Carotid stenosis   . COPD (chronic obstructive pulmonary disease) ([place])   . Coronary artery disease   . Diabetes mellitus without complication ([place])   . Hyperlipidemia   . Hypertension   . Myocardial infarction   . Personal history of tobacco use, presenting hazards to health 05/20/2016  . 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 Restless leg syndrome   . Squamous cell carcinoma   . Tubular adenoma of colon     Past Surgical History:  Procedure Laterality Date  . CAROTID ENDARTERECTOMY    . COLONOSCOPY WITH PROPOFOL N/A 09/06/2016   Procedure: COLONOSCOPY WITH PROPOFOL;  Surgeon: Manya Silvas, MD;  [facility]: [facility] ENDOSCOPY;  Service:  Endoscopy;  Laterality: N/A;  . CORONARY ANGIOPLASTY    . CORONARY ANGIOPLASTY      Social History Social History  Substance Use Topics  . Smoking status: Former Research scientist (life sciences)  . Smokeless tobacco: Never Used  . Alcohol use Not on file    Family History No family history on file. No family history of bleeding/clotting disorders, porphyria or autoimmune disease   Allergies  Allergen Reactions  . Penicillins Hives     REVIEW OF SYSTEMS (Negative unless checked)  Constitutional: [] Weight loss  [] Fever  [] Chills Cardiac: [] Chest pain   [] Chest pressure   [] Palpitations   [] Shortness of breath when laying flat   [] Shortness of breath with exertion. Vascular:  [] Pain in legs with walking   [] Pain in legs at rest  [] History of DVT   [] Phlebitis   [] Swelling in legs   [] Varicose veins   [] Non-healing ulcers Pulmonary:   [] Uses home oxygen   [] Productive cough   [] Hemoptysis   [] Wheeze  [] COPD   [] Asthma Neurologic:  [] Dizziness   [] Seizures   [] History of stroke   [] History of TIA  [] Aphasia   [] Vissual changes   [] Weakness or numbness in arm   [] Weakness or numbness in leg Musculoskeletal:   [] Joint swelling   [] Joint pain   [] Low back pain Hematologic:  [] Easy bruising  [] Easy bleeding   [] Hypercoagulable state   [] Anemic Gastrointestinal:  [] Diarrhea   [] Vomiting  [] Gastroesophageal  reflux/heartburn   [] Difficulty swallowing. Genitourinary:  [] Chronic kidney disease   [] Difficult urination  [] Frequent urination   [] Blood in urine Skin:  [] Rashes   [] Ulcers  Psychological:  [] History of anxiety   []  History of major depression.  Physical Examination  Vitals:   02/24/17 1416  BP: (!) 155/85  Pulse: (!) 53  Resp: 16  Weight: 197 lb (89.4 kg)   Body mass index is 28.27 kg/m. Gen: WD/WN, NAD Head: [place]/AT, No temporalis wasting.  Ear/Nose/Throat: Hearing grossly intact, nares w/o erythema or drainage, poor dentition Eyes: PER, EOMI, sclera nonicteric.  Neck: Supple, no masses.  No  bruit or JVD.  Pulmonary:  Good air movement, clear to auscultation bilaterally, no use of accessory muscles.  Cardiac: RRR, normal S1, S2, no Murmurs. Vascular: right cEA incisional scar well healed, bilateral carotid bruits Vessel Right Left  Radial Palpable Palpable  Ulnar Palpable Palpable  Brachial Palpable Palpable  Carotid Palpable Palpable  Femoral Palpable Palpable  Popliteal Palpable Palpable  PT Palpable Palpable  DP Palpable Palpable   Gastrointestinal: soft, non-distended. No guarding/no peritoneal signs.  Musculoskeletal: M/S 5/5 throughout.  No deformity or atrophy.  Neurologic: CN 2-12 intact. Pain and light touch intact in extremities.  Symmetrical.  Speech is fluent. Motor exam as listed above. Psychiatric: Judgment intact, Mood & affect appropriate for pt's clinical situation. Dermatologic: No rashes or ulcers noted.  No changes consistent with cellulitis. Lymph : No Cervical lymphadenopathy, no lichenification or skin changes of chronic lymphedema.  CBC No results found for: WBC, HGB, HCT, MCV, PLT  BMET No results found for: NA, K, CL, CO2, GLUCOSE, BUN, CREATININE, CALCIUM, GFRNONAA, GFRAA CrCl cannot be calculated (No order found.).  COAG No results found for: INR, PROTIME  Radiology No results found.  Assessment/Plan 1. Bilateral carotid artery stenosis Recommend:  Given the patient's asymptomatic subcritical stenosis no further invasive testing or surgery at this time.  Duplex ultrasound shows <70% stenosis bilaterally.  Continue antiplatelet therapy as prescribed Continue management of CAD, HTN and Hyperlipidemia Healthy heart diet,  encouraged exercise at least 4 times per week Follow up in 6 months with duplex ultrasound and physical exam based on >50% stenosis of the left carotid artery   - VAS US CAROTID; Future  2. Essential hypertension Continue antihypertensive medications as already ordered, these medications have been reviewed and  there are no changes at this time.   3. Pure hypercholesterolemia Continue statin as ordered and reviewed, no changes at this time     Hortencia Pilar, MD  02/24/2017 5:13 PM

## 2017-03-09 DIAGNOSIS — E785 Hyperlipidemia, unspecified: Secondary | ICD-10-CM | POA: Diagnosis not present

## 2017-03-09 DIAGNOSIS — I10 Essential (primary) hypertension: Secondary | ICD-10-CM | POA: Diagnosis not present

## 2017-03-09 DIAGNOSIS — E119 Type 2 diabetes mellitus without complications: Secondary | ICD-10-CM | POA: Diagnosis not present

## 2017-03-14 DIAGNOSIS — R7309 Other abnormal glucose: Secondary | ICD-10-CM | POA: Diagnosis not present

## 2017-03-14 DIAGNOSIS — E785 Hyperlipidemia, unspecified: Secondary | ICD-10-CM | POA: Diagnosis not present

## 2017-03-14 DIAGNOSIS — I259 Chronic ischemic heart disease, unspecified: Secondary | ICD-10-CM | POA: Diagnosis not present

## 2017-03-14 DIAGNOSIS — I10 Essential (primary) hypertension: Secondary | ICD-10-CM | POA: Diagnosis not present

## 2017-03-25 DIAGNOSIS — J439 Emphysema, unspecified: Secondary | ICD-10-CM | POA: Diagnosis not present

## 2017-03-25 DIAGNOSIS — I10 Essential (primary) hypertension: Secondary | ICD-10-CM | POA: Diagnosis not present

## 2017-03-25 DIAGNOSIS — I214 Non-ST elevation (NSTEMI) myocardial infarction: Secondary | ICD-10-CM | POA: Diagnosis not present

## 2017-03-25 DIAGNOSIS — I2510 Atherosclerotic heart disease of native coronary artery without angina pectoris: Secondary | ICD-10-CM | POA: Diagnosis not present

## 2017-04-20 DIAGNOSIS — I2510 Atherosclerotic heart disease of native coronary artery without angina pectoris: Secondary | ICD-10-CM | POA: Diagnosis not present

## 2017-04-20 DIAGNOSIS — I214 Non-ST elevation (NSTEMI) myocardial infarction: Secondary | ICD-10-CM | POA: Diagnosis not present

## 2017-04-27 DIAGNOSIS — E785 Hyperlipidemia, unspecified: Secondary | ICD-10-CM | POA: Diagnosis not present

## 2017-04-27 DIAGNOSIS — I10 Essential (primary) hypertension: Secondary | ICD-10-CM | POA: Diagnosis not present

## 2017-04-27 DIAGNOSIS — I2510 Atherosclerotic heart disease of native coronary artery without angina pectoris: Secondary | ICD-10-CM | POA: Diagnosis not present

## 2017-04-27 DIAGNOSIS — E119 Type 2 diabetes mellitus without complications: Secondary | ICD-10-CM | POA: Diagnosis not present

## 2017-04-27 DIAGNOSIS — J449 Chronic obstructive pulmonary disease, unspecified: Secondary | ICD-10-CM | POA: Diagnosis not present

## 2017-09-06 DIAGNOSIS — Z23 Encounter for immunization: Secondary | ICD-10-CM | POA: Diagnosis not present

## 2017-09-12 DIAGNOSIS — R0602 Shortness of breath: Secondary | ICD-10-CM | POA: Diagnosis not present

## 2017-09-14 DIAGNOSIS — R7303 Prediabetes: Secondary | ICD-10-CM | POA: Diagnosis not present

## 2017-09-14 DIAGNOSIS — E785 Hyperlipidemia, unspecified: Secondary | ICD-10-CM | POA: Diagnosis not present

## 2017-09-14 DIAGNOSIS — I10 Essential (primary) hypertension: Secondary | ICD-10-CM | POA: Diagnosis not present

## 2017-09-27 DIAGNOSIS — I10 Essential (primary) hypertension: Secondary | ICD-10-CM | POA: Diagnosis not present

## 2017-09-27 DIAGNOSIS — R7309 Other abnormal glucose: Secondary | ICD-10-CM | POA: Diagnosis not present

## 2017-09-27 DIAGNOSIS — E785 Hyperlipidemia, unspecified: Secondary | ICD-10-CM | POA: Diagnosis not present

## 2017-10-25 DIAGNOSIS — E785 Hyperlipidemia, unspecified: Secondary | ICD-10-CM | POA: Diagnosis not present

## 2017-10-25 DIAGNOSIS — I2510 Atherosclerotic heart disease of native coronary artery without angina pectoris: Secondary | ICD-10-CM | POA: Diagnosis not present

## 2017-10-25 DIAGNOSIS — J449 Chronic obstructive pulmonary disease, unspecified: Secondary | ICD-10-CM | POA: Diagnosis not present

## 2017-10-25 DIAGNOSIS — E7800 Pure hypercholesterolemia, unspecified: Secondary | ICD-10-CM | POA: Diagnosis not present

## 2017-10-25 DIAGNOSIS — F17200 Nicotine dependence, unspecified, uncomplicated: Secondary | ICD-10-CM | POA: Diagnosis not present

## 2017-10-25 DIAGNOSIS — I10 Essential (primary) hypertension: Secondary | ICD-10-CM | POA: Diagnosis not present

## 2017-10-25 DIAGNOSIS — I214 Non-ST elevation (NSTEMI) myocardial infarction: Secondary | ICD-10-CM | POA: Diagnosis not present

## 2018-02-27 ENCOUNTER — Encounter (INDEPENDENT_AMBULATORY_CARE_PROVIDER_SITE_OTHER): Payer: Self-pay | Admitting: Vascular Surgery

## 2018-02-27 ENCOUNTER — Ambulatory Visit (INDEPENDENT_AMBULATORY_CARE_PROVIDER_SITE_OTHER): Payer: Medicare HMO | Admitting: Vascular Surgery

## 2018-02-27 ENCOUNTER — Ambulatory Visit (INDEPENDENT_AMBULATORY_CARE_PROVIDER_SITE_OTHER): Payer: Medicare HMO

## 2018-02-27 VITALS — BP 152/96 | HR 83 | Resp 16 | Wt 194.0 lb

## 2018-02-27 DIAGNOSIS — I6523 Occlusion and stenosis of bilateral carotid arteries: Secondary | ICD-10-CM

## 2018-02-27 DIAGNOSIS — E7800 Pure hypercholesterolemia, unspecified: Secondary | ICD-10-CM | POA: Diagnosis not present

## 2018-02-27 DIAGNOSIS — I10 Essential (primary) hypertension: Secondary | ICD-10-CM

## 2018-02-27 NOTE — Progress Notes (Signed)
MRN : [id number]  Seth Rivera is a 72 y.o. (05/26/1946) male who presents with chief complaint of No chief complaint on file. Marland Kitchen  History of Present Illness:   The patient is seen for follow up evaluation of carotid stenosis. The carotid stenosis followed by ultrasound.   The patient denies amaurosis fugax. There is no recent history of TIA symptoms or focal motor deficits. There is no prior documented CVA.  The patient is taking enteric-coated aspirin 81 mg daily.  There is no history of migraine headaches. There is no history of seizures.  The patient has a history of coronary artery disease, no recent episodes of angina or shortness of breath. The patient denies PAD or claudication symptoms. There is a history of hyperlipidemia which is being treated with a statin.   Carotid Duplex done today shows right patent CEA and left ICA 40-59%.  No change compared to last study in 03/01/2086    No outpatient medications have been marked as taking for the 02/27/18 encounter (Appointment) with Delana Meyer, Dolores Lory, MD.    Past Medical History:  Diagnosis Date  . Carotid stenosis   . COPD (chronic obstructive pulmonary disease) ([place])   . Coronary artery disease   . Diabetes mellitus without complication ([place])   . Hyperlipidemia   . Hypertension   . Myocardial infarction   . Personal history of tobacco use, presenting hazards to health 05/20/2016  . 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 Restless leg syndrome   . Squamous cell carcinoma   . Tubular adenoma of colon     Past Surgical History:  Procedure Laterality Date  . CAROTID ENDARTERECTOMY    . COLONOSCOPY WITH PROPOFOL N/A 09/06/2016   Procedure: COLONOSCOPY WITH PROPOFOL;  Surgeon: Manya Silvas, MD;  Location: [facility] ENDOSCOPY;  Service: Endoscopy;  Laterality: N/A;  . CORONARY ANGIOPLASTY    . CORONARY ANGIOPLASTY      Social History Social History   Tobacco Use  . Smoking status: Former Research scientist (life sciences)  . Smokeless tobacco:  Never Used  Substance Use Topics  . Alcohol use: Not on file  . Drug use: Not on file    Family History No family history on file.  Allergies  Allergen Reactions  . Penicillins Hives     REVIEW OF SYSTEMS (Negative unless checked)  Constitutional: [] Weight loss  [] Fever  [] Chills Cardiac: [] Chest pain   [] Chest pressure   [] Palpitations   [] Shortness of breath when laying flat   [] Shortness of breath with exertion. Vascular:  [] Pain in legs with walking   [] Pain in legs at rest  [] History of DVT   [] Phlebitis   [] Swelling in legs   [] Varicose veins   [] Non-healing ulcers Pulmonary:   [] Uses home oxygen   [] Productive cough   [] Hemoptysis   [] Wheeze  [] COPD   [] Asthma Neurologic:  [] Dizziness   [] Seizures   [] History of stroke   [] History of TIA  [] Aphasia   [] Vissual changes   [] Weakness or numbness in arm   [] Weakness or numbness in leg Musculoskeletal:   [] Joint swelling   [x] Joint pain   [] Low back pain Hematologic:  [] Easy bruising  [] Easy bleeding   [] Hypercoagulable state   [] Anemic Gastrointestinal:  [] Diarrhea   [] Vomiting  [] Gastroesophageal reflux/heartburn   [] Difficulty swallowing. Genitourinary:  [] Chronic kidney disease   [] Difficult urination  [] Frequent urination   [] Blood in urine Skin:  [] Rashes   [] Ulcers  Psychological:  [] History of anxiety   []  History of major depression.  Physical Examination  There were no vitals filed for this visit. There is no height or weight on file to calculate BMI. Gen: WD/WN, NAD Head: [place]/AT, No temporalis wasting.  Ear/Nose/Throat: Hearing grossly intact, nares w/o erythema or drainage Eyes: PER, EOMI, sclera nonicteric.  Neck: Supple, no large masses.   Pulmonary:  Good air movement, no audible wheezing bilaterally, no use of accessory muscles.  Cardiac: RRR, no JVD Vascular:  Well healed right CEA incisional scar, left carotid bruit Vessel Right Left  Radial Palpable Palpable  Ulnar Palpable Palpable  Brachial Palpable  Palpable  Carotid Palpable Palpable  Gastrointestinal: Non-distended. No guarding/no peritoneal signs.  Musculoskeletal: M/S 5/5 throughout.  No deformity or atrophy.  Neurologic: CN 2-12 intact. Symmetrical.  Speech is fluent. Motor exam as listed above. Psychiatric: Judgment intact, Mood & affect appropriate for pt's clinical situation. Dermatologic: No rashes or ulcers noted.  No changes consistent with cellulitis. Lymph : No lichenification or skin changes of chronic lymphedema.  CBC No results found for: WBC, HGB, HCT, MCV, PLT  BMET No results found for: NA, K, CL, CO2, GLUCOSE, BUN, CREATININE, CALCIUM, GFRNONAA, GFRAA CrCl cannot be calculated (No order found.).  COAG No results found for: INR, PROTIME  Radiology No results found.   Assessment/Plan 1. Bilateral carotid artery stenosis Recommend:  Given the patient's asymptomatic subcritical stenosis no further invasive testing or surgery at this time.  Duplex ultrasound shows <40% stenosis bilaterally.  Continue antiplatelet therapy as prescribed Continue management of CAD, HTN and Hyperlipidemia Healthy heart diet,  encouraged exercise at least 4 times per week Follow up in 2 years with duplex ultrasound and physical exam   - VAS US CAROTID; Future  2. Essential hypertension Continue antihypertensive medications as already ordered, these medications have been reviewed and there are no changes at this time.   3. Pure hypercholesterolemia Continue statin as ordered and reviewed, no changes at this time     Hortencia Pilar, MD  02/27/2018 1:00 PM

## 2018-03-22 DIAGNOSIS — I259 Chronic ischemic heart disease, unspecified: Secondary | ICD-10-CM | POA: Diagnosis not present

## 2018-03-22 DIAGNOSIS — R7303 Prediabetes: Secondary | ICD-10-CM | POA: Diagnosis not present

## 2018-03-22 DIAGNOSIS — I10 Essential (primary) hypertension: Secondary | ICD-10-CM | POA: Diagnosis not present

## 2018-03-22 DIAGNOSIS — E785 Hyperlipidemia, unspecified: Secondary | ICD-10-CM | POA: Diagnosis not present

## 2018-03-26 IMAGING — CT CT CHEST W/O CM
1 series · 15 of 34 positions shown, 19 images · non-contrast
Comparison: 04/27/2010 chest CT.

CLINICAL DATA: Shortness of breath. Current smoker. Pulmonary
emphysema.

EXAM:
CT CHEST WITHOUT CONTRAST
TECHNIQUE: Multidetector CT imaging of the chest was performed following the
standard protocol without IV contrast.

[Series 2: thorax · axial · 0.75mm/px · z∈[-668,-386]mm · 15 of 167 slices shown, 19 images]
[im 13/167  mediastinal]
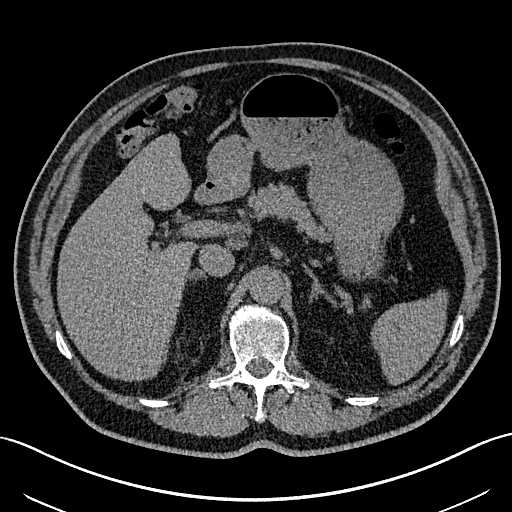
[im 13/167  lung]
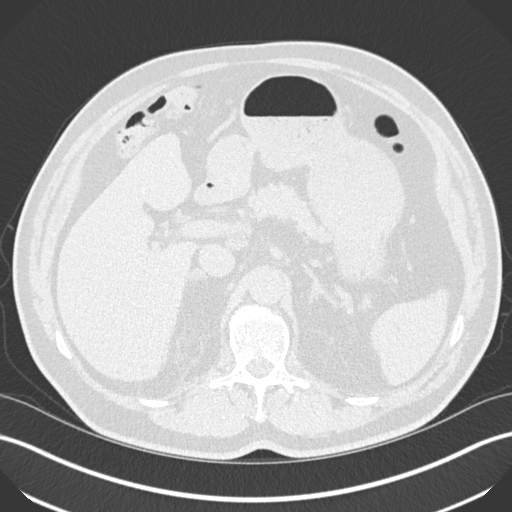
[im 25/167  lung]
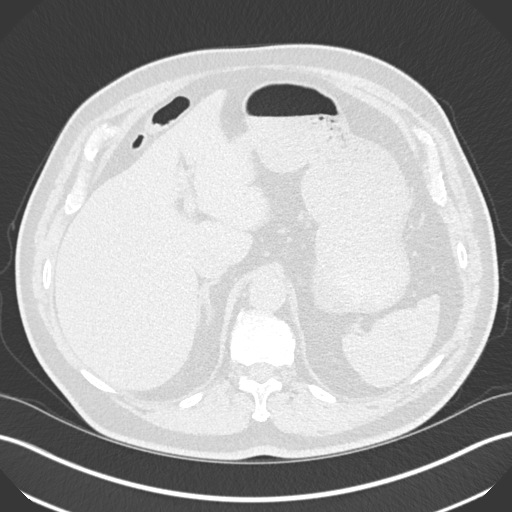
[im 34/167  lung]
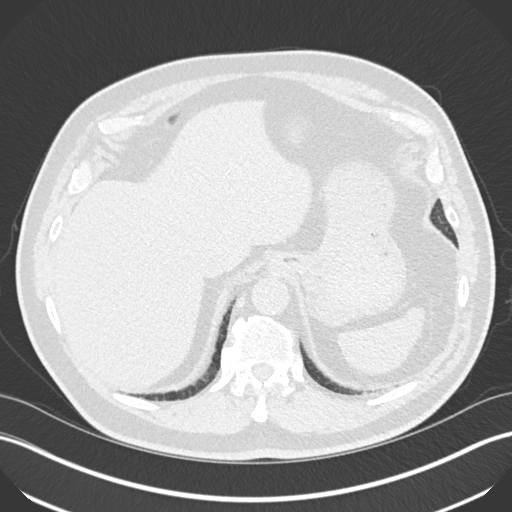
[im 44/167  lung]
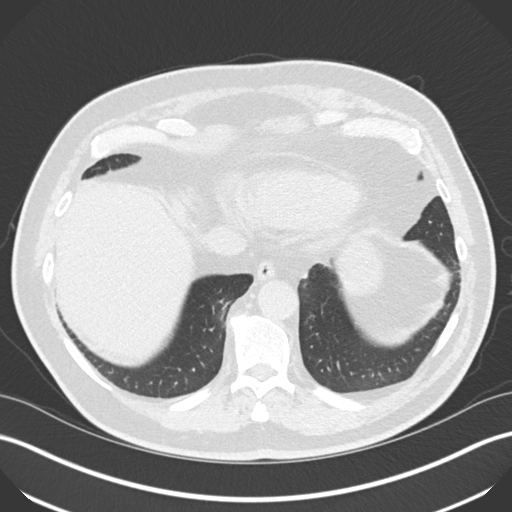
[im 56/167  mediastinal]
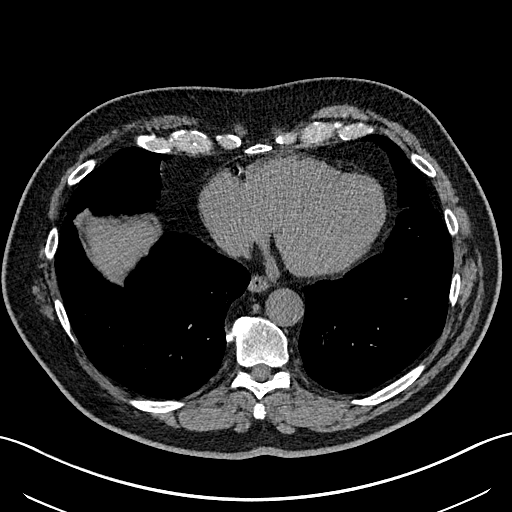
[im 56/167  lung]
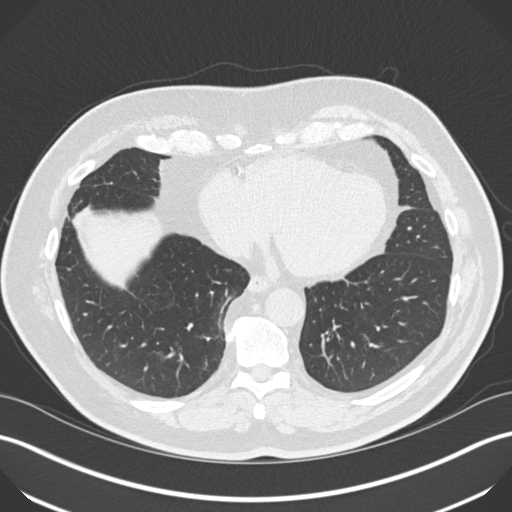
[im 67/167  lung]
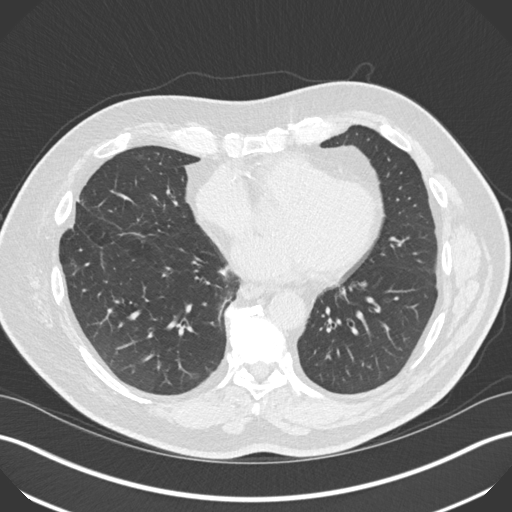
[im 74/167  lung]
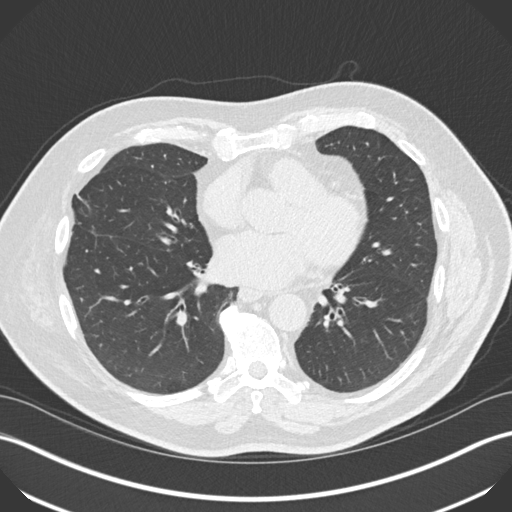
[im 87/167  lung]
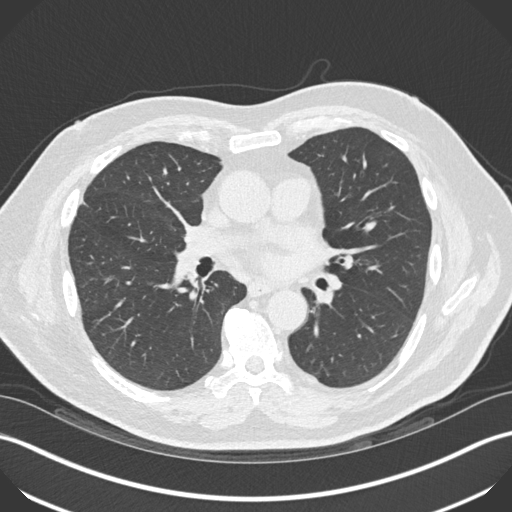
[im 93/167  mediastinal]
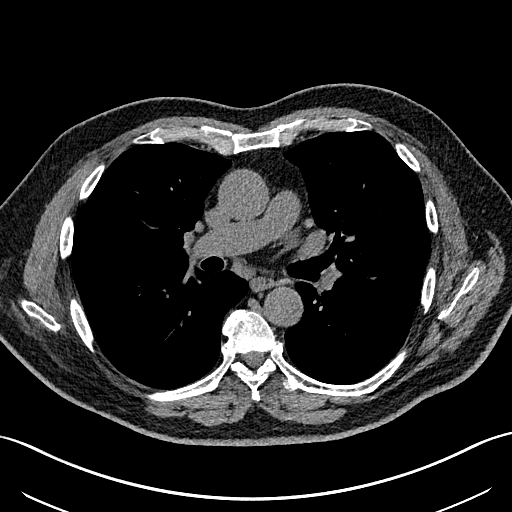
[im 93/167  lung]
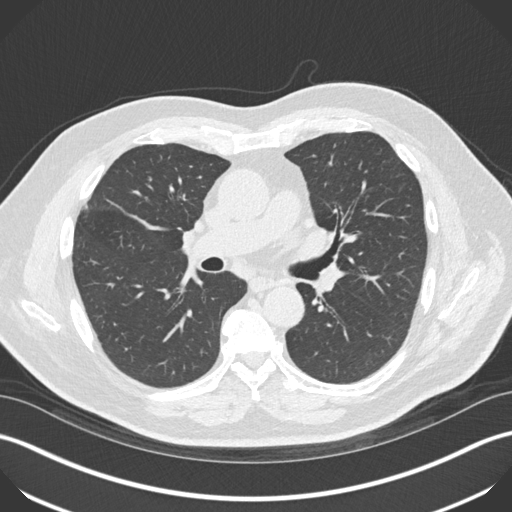
[im 100/167  lung]
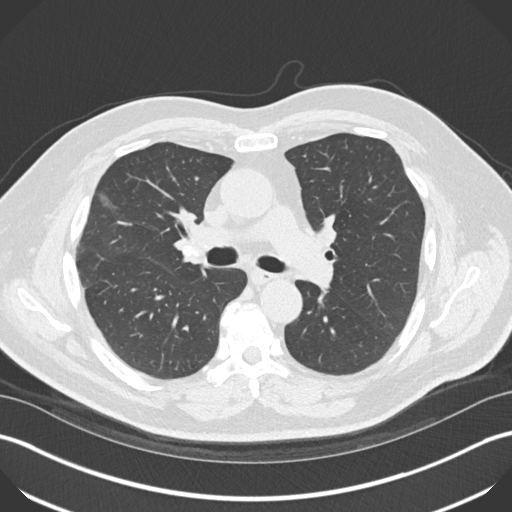
[im 111/167  lung]
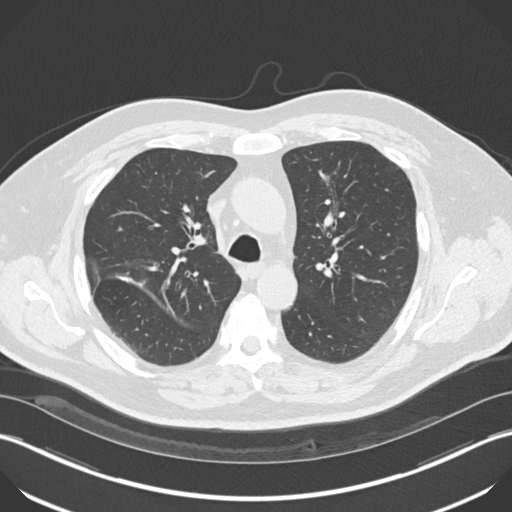
[im 123/167  lung]
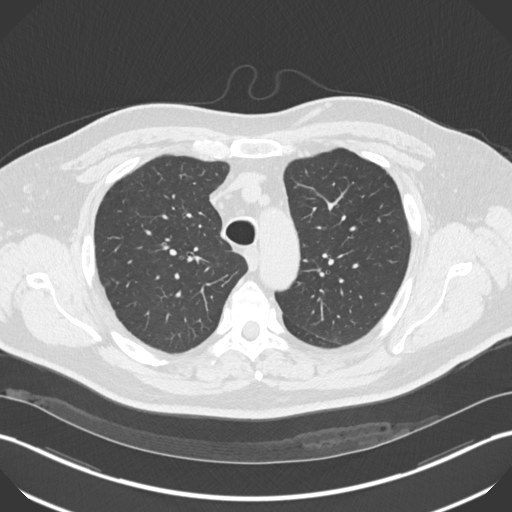
[im 133/167  mediastinal]
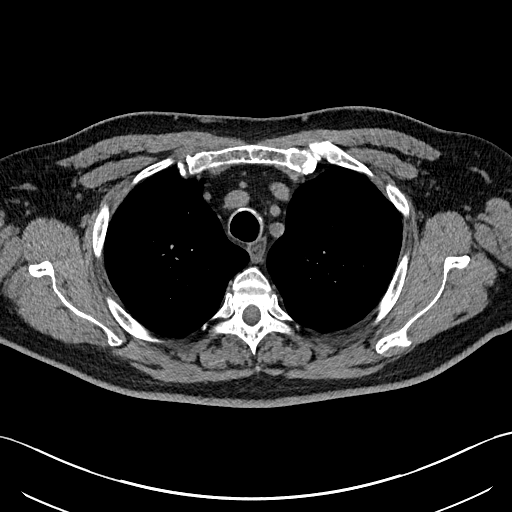
[im 133/167  lung]
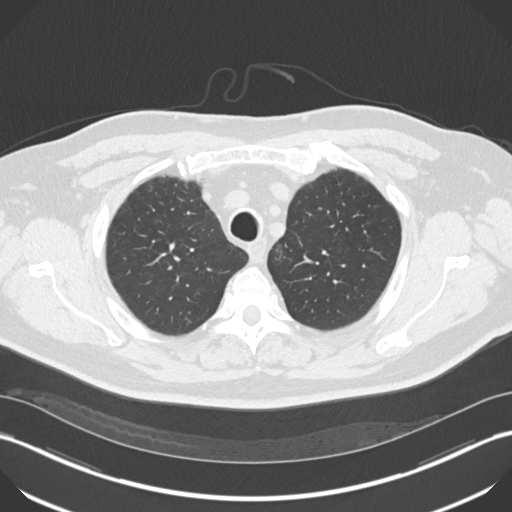
[im 142/167  lung]
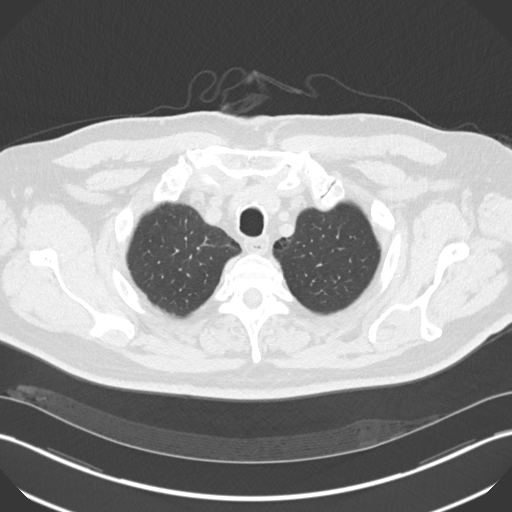
[im 154/167  lung]
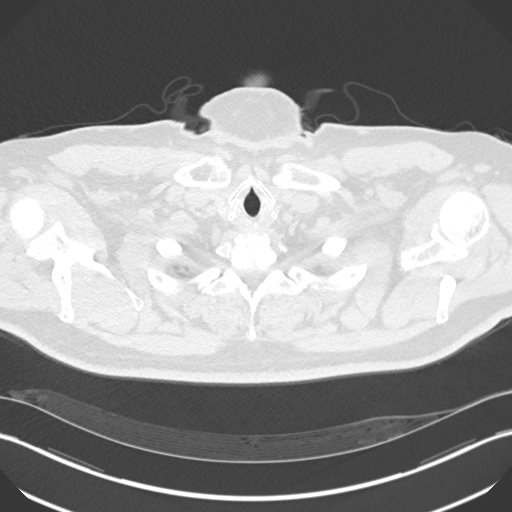

[15 of 34 positions shown; findings below may reference images not displayed]

FINDINGS: Mediastinum/Nodes: Normal heart size. No significant pericardial
fluid/thickening. Left anterior descending and right coronary
atherosclerosis. Atherosclerotic nonaneurysmal thoracic aorta.
Normal caliber pulmonary arteries. No discrete thyroid nodules.
Unremarkable esophagus. No pathologically enlarged axillary,
mediastinal or gross hilar lymph nodes, noting limited sensitivity
for the detection of hilar adenopathy on this noncontrast study.
Coarsely calcified subcentimeter right paratracheal and right hilar
nodes from prior granulomatous disease, unchanged.

Lungs/Pleura: No pneumothorax. No pleural effusion. Minimal
centrilobular and mild paraseptal emphysema, most prominent at the
lung apices. Mild diffuse bronchial wall thickening. No acute
consolidative airspace disease, lung masses or significant pulmonary
nodules. Mild osteophyte related osteoporosis in the subpleural
medial right lower lobe. Surgical sutures and scarring in the
posterior basilar right upper lobe and peripheral right middle lobe.
No significant regions of subpleural reticulation, ground-glass
attenuation, traction bronchiectasis or frank honeycombing on this
routine chest CT.

Upper abdomen: Unremarkable.

Musculoskeletal: No aggressive appearing focal osseous lesions.
Moderate thoracic spondylosis.
IMPRESSION: 1. Minimal centrilobular and mild paraseptal emphysema with mild
diffuse bronchial wall thickening, imaging findings that may
indicate COPD.
2. No acute pulmonary disease.
3. Aortic atherosclerosis. 2 vessel coronary atherosclerosis.

## 2018-03-28 DIAGNOSIS — E785 Hyperlipidemia, unspecified: Secondary | ICD-10-CM | POA: Diagnosis not present

## 2018-03-28 DIAGNOSIS — I10 Essential (primary) hypertension: Secondary | ICD-10-CM | POA: Diagnosis not present

## 2018-03-28 DIAGNOSIS — R7309 Other abnormal glucose: Secondary | ICD-10-CM | POA: Diagnosis not present

## 2018-05-01 DIAGNOSIS — E785 Hyperlipidemia, unspecified: Secondary | ICD-10-CM | POA: Diagnosis not present

## 2018-05-01 DIAGNOSIS — I2510 Atherosclerotic heart disease of native coronary artery without angina pectoris: Secondary | ICD-10-CM | POA: Diagnosis not present

## 2018-05-01 DIAGNOSIS — J449 Chronic obstructive pulmonary disease, unspecified: Secondary | ICD-10-CM | POA: Diagnosis not present

## 2018-05-01 DIAGNOSIS — I214 Non-ST elevation (NSTEMI) myocardial infarction: Secondary | ICD-10-CM | POA: Diagnosis not present

## 2018-05-01 DIAGNOSIS — I10 Essential (primary) hypertension: Secondary | ICD-10-CM | POA: Diagnosis not present

## 2018-05-01 DIAGNOSIS — E7800 Pure hypercholesterolemia, unspecified: Secondary | ICD-10-CM | POA: Diagnosis not present

## 2018-06-23 ENCOUNTER — Emergency Department
Admission: EM | Admit: 2018-06-23 | Discharge: 2018-06-23 | Disposition: A | Discharge status: Home / Self Care | Payer: Medicare HMO | Attending: Emergency Medicine | Admitting: Emergency Medicine

## 2018-06-23 ENCOUNTER — Emergency Department: Payer: Medicare HMO

## 2018-06-23 ENCOUNTER — Encounter: Payer: Self-pay | Admitting: Emergency Medicine

## 2018-06-23 DIAGNOSIS — R200 Anesthesia of skin: Secondary | ICD-10-CM | POA: Insufficient documentation

## 2018-06-23 DIAGNOSIS — Z7902 Long term (current) use of antithrombotics/antiplatelets: Secondary | ICD-10-CM | POA: Insufficient documentation

## 2018-06-23 DIAGNOSIS — I2510 Atherosclerotic heart disease of native coronary artery without angina pectoris: Secondary | ICD-10-CM | POA: Insufficient documentation

## 2018-06-23 DIAGNOSIS — Z79899 Other long term (current) drug therapy: Secondary | ICD-10-CM | POA: Diagnosis not present

## 2018-06-23 DIAGNOSIS — Z87891 Personal history of nicotine dependence: Secondary | ICD-10-CM | POA: Insufficient documentation

## 2018-06-23 DIAGNOSIS — M25512 Pain in left shoulder: Secondary | ICD-10-CM | POA: Diagnosis not present

## 2018-06-23 DIAGNOSIS — Z7982 Long term (current) use of aspirin: Secondary | ICD-10-CM | POA: Diagnosis not present

## 2018-06-23 DIAGNOSIS — E785 Hyperlipidemia, unspecified: Secondary | ICD-10-CM | POA: Insufficient documentation

## 2018-06-23 DIAGNOSIS — J449 Chronic obstructive pulmonary disease, unspecified: Secondary | ICD-10-CM | POA: Insufficient documentation

## 2018-06-23 DIAGNOSIS — I252 Old myocardial infarction: Secondary | ICD-10-CM | POA: Insufficient documentation

## 2018-06-23 DIAGNOSIS — I10 Essential (primary) hypertension: Secondary | ICD-10-CM | POA: Insufficient documentation

## 2018-06-23 LAB — BASIC METABOLIC PANEL
ANION GAP: 12 (ref 5–15)
BUN: 19 mg/dL (ref 8–23)
CO2: 23 mmol/L (ref 22–32)
Calcium: 8.9 mg/dL (ref 8.9–10.3)
Chloride: 107 mmol/L (ref 98–111)
Creatinine, Ser: 0.81 mg/dL (ref 0.61–1.24)
Glucose, Bld: 126 mg/dL — ABNORMAL HIGH (ref 70–99)
Potassium: 4.1 mmol/L (ref 3.5–5.1)
SODIUM: 142 mmol/L (ref 135–145)

## 2018-06-23 LAB — TROPONIN I
TROPONIN I: 0.07 ng/mL — AB (ref <0.03)
Troponin I: 0.08 ng/mL (ref <0.03)

## 2018-06-23 LAB — CBC WITH DIFFERENTIAL/PLATELET
BASOS ABS: 0 10*3/uL (ref 0–0.1)
Basophils Relative: 1 %
Eosinophils Absolute: 0.2 10*3/uL (ref 0–0.7)
Eosinophils Relative: 3 %
HEMATOCRIT: 43.1 % (ref 40.0–52.0)
HEMOGLOBIN: 14.6 g/dL (ref 13.0–18.0)
Lymphocytes Relative: 39 %
Lymphs Abs: 2.8 10*3/uL (ref 1.0–3.6)
MCH: 31.4 pg (ref 26.0–34.0)
MCHC: 33.9 g/dL (ref 32.0–36.0)
MCV: 92.7 fL (ref 80.0–100.0)
Monocytes Absolute: 0.5 10*3/uL (ref 0.2–1.0)
Monocytes Relative: 7 %
NEUTROS ABS: 3.7 10*3/uL (ref 1.4–6.5)
NEUTROS PCT: 50 %
Platelets: 214 10*3/uL (ref 150–440)
RBC: 4.65 MIL/uL (ref 4.40–5.90)
RDW: 13.5 % (ref 11.5–14.5)
WBC: 7.3 10*3/uL (ref 3.8–10.6)

## 2018-06-23 LAB — LACTIC ACID, PLASMA: LACTIC ACID, VENOUS: 1.2 mmol/L (ref 0.5–1.9)

## 2018-06-23 MED ORDER — CLOPIDOGREL BISULFATE 75 MG PO TABS: 75.0000 mg | ORAL_TABLET | Freq: Every day | ORAL | 1 refills | Status: AC

## 2018-06-23 MED ORDER — IOPAMIDOL (ISOVUE-370) INJECTION 76%
100.0000 mL | Freq: Once | INTRAVENOUS | Status: AC | PRN
  Administered 2018-06-23: 100 mL via INTRAVENOUS

## 2018-06-23 NOTE — ED Provider Notes (Signed)
[facility] Emergency Department Provider Note ____________________________________________   First MD Initiated Contact with Patient 06/23/18 [phone]     (approximate)  I have reviewed the triage vital signs and the nursing notes.   HISTORY  Chief Complaint Numbness    HPI Seth Rivera is a 72 y.o. male with PMH as noted below who presents with numbness and change in color to the left fifth digit, acute onset yesterday, worsening yesterday evening, and now somewhat improved at this morning.  He states that the symptoms only occurred in the fifth digit.  No prior history of this type of episode.  He denies any other weakness or numbness in the face, the rest of the upper extremities or lower extremities.  No chest pain or difficulty breathing.  Past Medical History:  Diagnosis Date  . Carotid stenosis   . COPD (chronic obstructive pulmonary disease) ([place])   . Coronary artery disease   . Hyperlipidemia   . Myocardial infarction ([place])   . Personal history of tobacco use, presenting hazards to health 05/20/2016  . 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 Restless leg syndrome   . Tubular adenoma of colon     Patient Active Problem List   Diagnosis Date Noted  . Carotid stenosis 02/24/2017  . Essential hypertension 02/24/2017  . Pure hypercholesterolemia 02/24/2017  . Personal history of tobacco use, presenting hazards to health 05/20/2016    Past Surgical History:  Procedure Laterality Date  . CAROTID ENDARTERECTOMY    . COLONOSCOPY WITH PROPOFOL N/A 09/06/2016   Procedure: COLONOSCOPY WITH PROPOFOL;  Surgeon: Manya Silvas, MD;  Location: [facility] ENDOSCOPY;  Service: Endoscopy;  Laterality: N/A;  . CORONARY ANGIOPLASTY    . CORONARY ANGIOPLASTY      Prior to Admission medications   Medication Sig Start Date End Date Taking? Authorizing Provider  aspirin EC 81 MG tablet Take 81 mg by mouth daily.   Yes [provider]  atenolol (TENORMIN) 50 MG  tablet Take 25 mg by mouth daily.    Yes [provider]  atorvastatin (LIPITOR) 40 MG tablet Take 40 mg by mouth daily.   Yes [provider]  vitamin B-12 (CYANOCOBALAMIN) 1000 MCG tablet Take 1,000 mcg by mouth daily.   Yes [provider]  clopidogrel (PLAVIX) 75 MG tablet Take 1 tablet (75 mg total) by mouth daily. 06/23/18 08/22/18  Arta Silence, MD    Allergies Penicillins  Family History  Problem Relation Age of Onset  . Lymphoma Mother   . Lung cancer Father   . Heart attack Brother   . Diabetes Brother     Social History Social History   Tobacco Use  . Smoking status: Former Research scientist (life sciences)  . Smokeless tobacco: Never Used  Substance Use Topics  . Alcohol use: No    Frequency: Never  . Drug use: No    Review of Systems  Constitutional: No fever. Eyes: No visual changes. ENT: No neck pain. Cardiovascular: Denies chest pain. Respiratory: Denies shortness of breath. Gastrointestinal: No vomiting Genitourinary: Negative for flank pain.  Musculoskeletal: Negative for back pain. Skin: Negative for rash. Neurological: Negative for headaches.  Positive for left fifth digit numbness.   ____________________________________________   PHYSICAL EXAM:  VITAL SIGNS: ED Triage Vitals  Enc Vitals Group     BP 06/23/18 0709 (!) 186/97     Pulse Rate 06/23/18 0709 (!) 57     Resp 06/23/18 0709 20     Temp 06/23/18 0709 98.5 F (  36.9 C)     Temp Source 06/23/18 0709 Oral     SpO2 06/23/18 0709 95 %     Weight 06/23/18 0710 190 lb (86.2 kg)     Height 06/23/18 0710 5\' 9"  (1.753 m)     Head Circumference --      Peak Flow --      Pain Score 06/23/18 0710 0     Pain Loc --      Pain Edu? --      Excl. in [place]? --     Constitutional: Alert and oriented. Well appearing and in no acute distress. Eyes: Conjunctivae are normal.  EOMI.  PERRLA. Head: Atraumatic. Nose: No congestion/rhinnorhea. Mouth/Throat: Mucous membranes are moist.   Neck:  Normal range of motion.  Cardiovascular: Normal rate, regular rhythm. Grossly normal heart sounds.  Good peripheral circulation. Respiratory: Normal respiratory effort.  No retractions. Lungs CTAB. Gastrointestinal: No distention.  Musculoskeletal: No lower extremity edema.  Cap refill 4 seconds in left fifth digit, with slight pallor to the fifth digit..  2+ radial pulses bilaterally. Neurologic:  Normal speech and language.  Cranial nerves III through XII intact.  Motor and sensory intact in all extremities except for mild subjective decreased sensation to left fifth digit. Skin:  Skin is warm and dry. No rash noted. Psychiatric: Mood and affect are normal. Speech and behavior are normal.  ____________________________________________   LABS (all labs ordered are listed, but only abnormal results are displayed)  Labs Reviewed  BASIC METABOLIC PANEL - Abnormal; Notable for the following components:      Result Value   Glucose, Bld 126 (*)    All other components within normal limits  TROPONIN I - Abnormal; Notable for the following components:   Troponin I 0.08 (*)    All other components within normal limits  TROPONIN I - Abnormal; Notable for the following components:   Troponin I 0.07 (*)    All other components within normal limits  CBC WITH DIFFERENTIAL/PLATELET  LACTIC ACID, PLASMA   ____________________________________________  EKG  ED ECG REPORT I, Arta Silence, the attending physician, personally viewed and interpreted this ECG.  Date: 06/23/2018 EKG Time: 0705 Rate: 60 Rhythm: normal sinus rhythm QRS Axis: normal Intervals: IVCD ST/T Wave abnormalities: normal Narrative Interpretation: no evidence of acute ischemia  ____________________________________________  RADIOLOGY  CT angio LUE: Mild narrowing of left subclavian artery at the thoracic inlet.  No other acute findings.  ____________________________________________   PROCEDURES  Procedure(s)  performed: No  Procedures  Critical Care performed: No ____________________________________________   INITIAL IMPRESSION / ASSESSMENT AND PLAN / ED COURSE  Pertinent labs & imaging results that were available during my care of the patient were reviewed by me and considered in my medical decision making (see chart for details).  71 year old male with PMH as noted above presents with left fifth digit numbness and change in color acute onset almost 20 hours ago, worsening yesterday evening, but then improved today.  No prior history of this, and no associated neurologic symptoms elsewhere.  On exam, the patient reports some numbness to the left fifth digit.  It does appear slightly pale compared to his other digits and the cap refill is delayed.  The remainder of his exam is unremarkable.  I reviewed the past medical records in Epic; the patient is followed by vascular surgery for subcritical carotid stenosis, but he is being medically managed.  No other relevant vascular history.  Overall I am most concerned for vascular etiology,  specifically a small embolus.  Given that there are no other neurologic symptoms and he does have abnormal capillary refill, this presentation is not consistent with a stroke.  There is no evidence of aortic dissection or other intrathoracic vascular cause.  It is also possible that this is a benign etiology such as ray nods, however I do not suspect this given that patient has no prior history of it and it is isolated to the one digit.  I will obtain a CT angio of the left upper extremity, lab work-up, and consult vascular surgery.  ----------------------------------------- 12:27 PM on 06/23/2018 -----------------------------------------  The patient's symptoms have gradually proved in the color of the involved digit now appears normal.  He states his sensation is coming back.  CT angiogram showed no significant acute findings to explain the patient's symptoms.   His lab work-up was unremarkable except for minimally elevated troponin.  Repeat troponin after 3 hours is trending downward.  Given that the patient has no chest pain, difficulty breathing or other anginal equivalents, or acute EKG abnormalities, there is no evidence of cardiac ischemia.  I consulted Dr. Trula Slade from vascular surgery and discussed the clinical presentation and results of the work-up.  He advised that he agrees that a small embolic event is most likely.  He advises that the minimal troponin elevation would be consistent with this as well.  However he states that there is no indication for acute vascular intervention.  He would recommend placing the patient on Plavix in addition to the aspirin he is already taking, and outpatient follow-up.  The patient feels well and wants to go home.  At this time, given the downward trending troponin and lack of cardiac symptoms, and his improving left upper extremity symptoms, he is stable for discharge home.  I explained the results of the work-up and the plan of care and he agrees.  He states he will follow-up with his vascular surgeon Dr. Delana Meyer as well as his primary care doctor.  Return precautions given, and he expresses understanding. ____________________________________________   FINAL CLINICAL IMPRESSION(S) / ED DIAGNOSES  Final diagnoses:  Numbness      NEW MEDICATIONS STARTED DURING THIS VISIT:  New Prescriptions   CLOPIDOGREL (PLAVIX) 75 MG TABLET    Take 1 tablet (75 mg total) by mouth daily.     Note:  This document was prepared using Dragon voice recognition software and may include unintentional dictation errors.    Arta Silence, MD 06/23/18 1229

## 2018-06-23 NOTE — ED Notes (Signed)
Pt assisted to restroom. Pt in NAD at this time and ambulating without difficulty.

## 2018-06-23 NOTE — ED Notes (Signed)
Patient transported to CT 

## 2018-06-23 NOTE — Discharge Instructions (Addendum)
Take the Plavix as prescribed until you follow-up with your doctors.  Continue to take your other medications as prescribed.  Make an appointment to follow-up with Dr. Delana Meyer, as well as with your primary care doctor in the next few weeks.  Return to the ER immediately for new, worsening, persistent numbness, weakness, or color change in the hand, arm or leg, symptoms such as severe headache, slurred speech, vision changes, or any other new or worsening symptoms that concern you.

## 2018-06-23 NOTE — ED Notes (Signed)
Troponin 0.08, provider will be notified

## 2018-06-23 NOTE — ED Triage Notes (Signed)
Pt stating left shoulder discomfort which started last night. Pt stating his 3 fingers in his hands progressively become "cool and numb." Pt's 5th finger is abnormal in color and cool. Pt stating decreased sensitivity in left versus right hand. Radial pulse palpable and bounding.

## 2018-06-26 ENCOUNTER — Encounter (INDEPENDENT_AMBULATORY_CARE_PROVIDER_SITE_OTHER): Payer: Self-pay | Admitting: Vascular Surgery

## 2018-06-26 ENCOUNTER — Ambulatory Visit (INDEPENDENT_AMBULATORY_CARE_PROVIDER_SITE_OTHER): Payer: Medicare HMO | Admitting: Vascular Surgery

## 2018-06-26 VITALS — BP 132/79 | HR 60 | Resp 16 | Ht 69.5 in | Wt 194.0 lb

## 2018-06-26 DIAGNOSIS — G540 Brachial plexus disorders: Secondary | ICD-10-CM | POA: Diagnosis not present

## 2018-06-26 DIAGNOSIS — I998 Other disorder of circulatory system: Secondary | ICD-10-CM | POA: Diagnosis not present

## 2018-06-26 DIAGNOSIS — I6523 Occlusion and stenosis of bilateral carotid arteries: Secondary | ICD-10-CM

## 2018-06-26 DIAGNOSIS — I10 Essential (primary) hypertension: Secondary | ICD-10-CM | POA: Diagnosis not present

## 2018-06-26 NOTE — Progress Notes (Signed)
MRN : [id number]  Seth Rivera is a 72 y.o. (1946-08-15) male who presents with chief complaint of  Chief Complaint  Patient presents with  . Follow-up    left arm blood clot  .  History of Present Illness:   The patient is seen for evaluation of painful upper extremity. The patient notes that the pain began about two weeks ago very abruptly.  Initially it was very painful but has improve quite a bit.  It seems to have affected only the left 5th finger.  The patient states arm pain is having a significant negative impact on quality of life and daily activities.  He was seen in the ER for these symptoms and a CT angiogram of the left arm showed narrowing of the left subclavian consistent with TOS.  The patient denies a history of degenerative spine disease.  The patient denies rest pain. No significant weakness or atrophy is reported. There are no open wounds or sores at this time.  No prior vascular interventions or vascular surgeries.  The patient denies amaurosis fugax or recent TIA symptoms. There are no recent neurological changes noted.  He is s/p right CEA remotely.  The patient denies history of DVT, PE or superficial thrombophlebitis. The patient denies recent episodes of angina or shortness of breath.  Current Meds  Medication Sig  . aspirin EC 81 MG tablet Take 81 mg by mouth daily.  Marland Kitchen atenolol (TENORMIN) 50 MG tablet Take 25 mg by mouth daily.   Marland Kitchen atorvastatin (LIPITOR) 40 MG tablet Take 40 mg by mouth daily.  . clopidogrel (PLAVIX) 75 MG tablet Take 1 tablet (75 mg total) by mouth daily.  . vitamin B-12 (CYANOCOBALAMIN) 1000 MCG tablet Take 1,000 mcg by mouth daily.    Past Medical History:  Diagnosis Date  . Carotid stenosis   . COPD (chronic obstructive pulmonary disease) ([place])   . Coronary artery disease   . Hyperlipidemia   . Myocardial infarction ([place])   . Personal history of tobacco use, presenting hazards to health 05/20/2016  . 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 Restless leg syndrome   . Tubular adenoma of colon     Past Surgical History:  Procedure Laterality Date  . CAROTID ENDARTERECTOMY    . COLONOSCOPY WITH PROPOFOL N/A 09/06/2016   Procedure: COLONOSCOPY WITH PROPOFOL;  Surgeon: Manya Silvas, MD;  Location: [facility] ENDOSCOPY;  Service: Endoscopy;  Laterality: N/A;  . CORONARY ANGIOPLASTY    . CORONARY ANGIOPLASTY      Social History Social History   Tobacco Use  . Smoking status: Former Research scientist (life sciences)  . Smokeless tobacco: Never Used  Substance Use Topics  . Alcohol use: No    Frequency: Never  . Drug use: No    Family History Family History  Problem Relation Age of Onset  . Lymphoma Mother   . Lung cancer Father   . Heart attack Brother   . Diabetes Brother   No family history of bleeding/clotting disorders, porphyria or autoimmune disease   Allergies  Allergen Reactions  . Penicillins Hives    Has patient had a PCN reaction causing immediate rash, facial/tongue/throat swelling, SOB or lightheadedness with hypotension: Yes Has patient had a PCN reaction causing severe rash involving mucus membranes or skin necrosis: No Has patient had a PCN reaction that required hospitalization: No Has patient had a PCN reaction occurring within the last 10 years: No If all of the above answers are "NO", then may proceed with Cephalosporin  use.      REVIEW OF SYSTEMS (Negative unless checked)  Constitutional: [] Weight loss  [] Fever  [] Chills Cardiac: [] Chest pain   [] Chest pressure   [] Palpitations   [] Shortness of breath when laying flat   [] Shortness of breath with exertion. Vascular:  [] Pain in legs with walking   [] Pain in legs at rest  [] History of DVT   [] Phlebitis   [] Swelling in legs   [] Varicose veins   [] Non-healing ulcers Pulmonary:   [] Uses home oxygen   [] Productive cough   [] Hemoptysis   [] Wheeze  [] COPD   [] Asthma Neurologic:  [] Dizziness   [] Seizures   [] History of stroke   [] History of TIA  [] Aphasia    [] Vissual changes   [] Weakness or numbness in arm   [] Weakness or numbness in leg Musculoskeletal:   [] Joint swelling   [] Joint pain   [] Low back pain Hematologic:  [] Easy bruising  [] Easy bleeding   [] Hypercoagulable state   [] Anemic Gastrointestinal:  [] Diarrhea   [] Vomiting  [] Gastroesophageal reflux/heartburn   [] Difficulty swallowing. Genitourinary:  [] Chronic kidney disease   [] Difficult urination  [] Frequent urination   [] Blood in urine Skin:  [] Rashes   [] Ulcers  Psychological:  [] History of anxiety   []  History of major depression.  Physical Examination  Vitals:   06/26/18 0925  BP: 132/79  Pulse: 60  Resp: 16  Weight: 194 lb (88 kg)  Height: 5' 9.5" (1.765 m)   Body mass index is 28.24 kg/m. Gen: WD/WN, NAD Head: [place]/AT, No temporalis wasting.  Ear/Nose/Throat: Hearing grossly intact, nares w/o erythema or drainage, poor dentition Eyes: PER, EOMI, sclera nonicteric.  Neck: Supple, no masses.  No bruit or JVD.  Pulmonary:  Good air movement, clear to auscultation bilaterally, no use of accessory muscles.  Cardiac: RRR, normal S1, S2, no Murmurs. Vascular: bilateral carotid bruits; left 5th finger pink with good cap refill Vessel Right Left  Radial Palpable Palpable  Ulnar Palpable Palpable  Brachial Palpable Palpable  Carotid Palpable Palpable  Gastrointestinal: soft, non-distended. No guarding/no peritoneal signs.  Musculoskeletal: M/S 5/5 throughout.  No deformity or atrophy.  Neurologic: CN 2-12 intact. Pain and light touch intact in extremities.  Symmetrical.  Speech is fluent. Motor exam as listed above. Psychiatric: Judgment intact, Mood & affect appropriate for pt's clinical situation. Dermatologic: No rashes or ulcers noted.  No changes consistent with cellulitis. Lymph : No Cervical lymphadenopathy, no lichenification or skin changes of chronic lymphedema.  CBC Lab Results  Component Value Date   WBC 7.3 06/23/2018   HGB 14.6 06/23/2018   HCT 43.1  06/23/2018   MCV 92.7 06/23/2018   PLT 214 06/23/2018    BMET    Component Value Date/Time   NA 142 06/23/2018 0723   K 4.1 06/23/2018 0723   CL 107 06/23/2018 0723   CO2 23 06/23/2018 0723   GLUCOSE 126 (H) 06/23/2018 0723   BUN 19 06/23/2018 0723   CREATININE 0.81 06/23/2018 0723   CALCIUM 8.9 06/23/2018 0723   GFRNONAA >60 06/23/2018 0723   GFRAA >60 06/23/2018 0723   Estimated Creatinine Clearance: 88.6 mL/min (by C-G formula based on SCr of 0.81 mg/dL).  COAG No results found for: INR, PROTIME  Radiology Ct Angio Up Extrem Left W &/or Wo Contast  Result Date: 06/23/2018 CLINICAL DATA:  left shoulder discomfort which started last night. Pt stating his 3 fingers in his left hand progressively become "cool and numb." Pt's 5th finger is abnormal in color and cool. Pt stating decreased sensitivity in  left versus right hand. Radial pulse palpable and bounding. EXAM: CT ANGIOGRAPHY OF THE LEFT UPPEREXTREMITY TECHNIQUE: Multidetector CT imaging of the left upperwas performed using the standard protocol during bolus administration of intravenous contrast. Multiplanar CT image reconstructions and MIPs were obtained to evaluate the vascular anatomy. CONTRAST:  135mL ISOVUE-370 IOPAMIDOL (ISOVUE-370) INJECTION 76% COMPARISON:  CT chest 08/03/2016 and previous FINDINGS: Vascular: Scattered coronary calcifications. Thoracic aorta unremarkable. No aneurysm, dissection, stenosis, or significant atheromatous plaque. Classic 3 vessel brachiocephalic arterial origin anatomy without proximal stenosis. Left subclavian artery is patent, with mild short-segment narrowing at the thoracic inlet. Left axillary and brachial arteries widely patent. Ulnar and radial arteries both patent across the wrist. Diminutive interosseous artery is patent in the proximal forearm. Limited evaluation of palmar arches. Calcified atheromatous plaque in the abdominal aorta without aneurysm. Short-segment origin stenosis of the  celiac axis at the level of the median arcuate ligament of the diaphragm. Calcified plaque in bilateral common iliac arteries without high-grade stenosis. Limited venous evaluation due to arterial phase contrast enhancement. Nonvascular: No mediastinal mass or adenopathy is identified. Unremarkable lower neck and axilla. No significant osseous or soft tissue abnormality in the left upper extremity. Vertebral endplate spurring at multiple levels in the mid and lower thoracic spine. Multilevel spondylitic changes in the lumbar spine most marked L5-S1. Left lung and visualized portions of right lung unremarkable. Descending and sigmoid diverticula noted. Mild prostatic enlargement. Remainder visualized portions of abdomen and pelvis unremarkable. Review of the MIP images confirms the above findings. IMPRESSION: 1. Short-segment mild narrowing of the subclavian artery at the level of thoracic inlet. 2. Otherwise unremarkable left upper extremity evaluation. 3. Coronary and aortoiliac atherosclerosis (ICD10-170.0). Electronically Signed   By: Lucrezia Europe M.D.   On: 06/23/2018 09:12     Assessment/Plan 1. 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  I have discussed first rib resection and will refer him to Dr Adriana Mccallum   A total of 70 minutes was spent with this patient and greater than 50% was spent in counseling and coordination of care with the patient.  Discussion included the treatment options for vascular disease including indications for surgery and intervention.  Also discussed is the appropriate timing of treatment.  In addition medical therapy was discussed.  - Ambulatory referral to Vascular Surgery  2. Thoracic outlet syndrome See #1  - Ambulatory referral to Vascular Surgery  3. Bilateral carotid artery stenosis Recommend:  Given the patient's asymptomatic subcritical stenosis no further invasive testing or surgery at this  time.  Continue antiplatelet therapy as prescribed Continue management of CAD, HTN and Hyperlipidemia Healthy heart diet,  encouraged exercise at least 4 times per week  4. Essential hypertension Continue antihypertensive medications as already ordered, these medications have been reviewed and there are no changes at this time.    Hortencia Pilar, MD  06/26/2018 9:40 AM

## 2018-07-13 DIAGNOSIS — Z955 Presence of coronary angioplasty implant and graft: Secondary | ICD-10-CM | POA: Diagnosis not present

## 2018-07-13 DIAGNOSIS — R531 Weakness: Secondary | ICD-10-CM | POA: Diagnosis not present

## 2018-07-13 DIAGNOSIS — M79662 Pain in left lower leg: Secondary | ICD-10-CM | POA: Diagnosis not present

## 2018-07-13 DIAGNOSIS — I2510 Atherosclerotic heart disease of native coronary artery without angina pectoris: Secondary | ICD-10-CM | POA: Diagnosis not present

## 2018-07-13 DIAGNOSIS — R202 Paresthesia of skin: Secondary | ICD-10-CM | POA: Diagnosis not present

## 2018-07-13 DIAGNOSIS — G540 Brachial plexus disorders: Secondary | ICD-10-CM | POA: Diagnosis not present

## 2018-07-13 DIAGNOSIS — I10 Essential (primary) hypertension: Secondary | ICD-10-CM | POA: Diagnosis not present

## 2018-07-13 DIAGNOSIS — R200 Anesthesia of skin: Secondary | ICD-10-CM | POA: Diagnosis not present

## 2018-07-13 DIAGNOSIS — Z720 Tobacco use: Secondary | ICD-10-CM | POA: Diagnosis not present

## 2018-07-13 DIAGNOSIS — F17210 Nicotine dependence, cigarettes, uncomplicated: Secondary | ICD-10-CM | POA: Diagnosis not present

## 2018-07-13 DIAGNOSIS — E785 Hyperlipidemia, unspecified: Secondary | ICD-10-CM | POA: Diagnosis not present

## 2018-07-25 DIAGNOSIS — Z955 Presence of coronary angioplasty implant and graft: Secondary | ICD-10-CM | POA: Diagnosis not present

## 2018-07-25 DIAGNOSIS — E785 Hyperlipidemia, unspecified: Secondary | ICD-10-CM | POA: Diagnosis not present

## 2018-07-25 DIAGNOSIS — J939 Pneumothorax, unspecified: Secondary | ICD-10-CM | POA: Diagnosis not present

## 2018-07-25 DIAGNOSIS — Z9889 Other specified postprocedural states: Secondary | ICD-10-CM | POA: Diagnosis not present

## 2018-07-25 DIAGNOSIS — I6523 Occlusion and stenosis of bilateral carotid arteries: Secondary | ICD-10-CM | POA: Insufficient documentation

## 2018-07-25 DIAGNOSIS — J9811 Atelectasis: Secondary | ICD-10-CM | POA: Diagnosis not present

## 2018-07-25 DIAGNOSIS — I2510 Atherosclerotic heart disease of native coronary artery without angina pectoris: Secondary | ICD-10-CM | POA: Diagnosis not present

## 2018-07-25 DIAGNOSIS — I10 Essential (primary) hypertension: Secondary | ICD-10-CM | POA: Diagnosis not present

## 2018-07-25 DIAGNOSIS — J90 Pleural effusion, not elsewhere classified: Secondary | ICD-10-CM | POA: Diagnosis not present

## 2018-07-25 DIAGNOSIS — E119 Type 2 diabetes mellitus without complications: Secondary | ICD-10-CM | POA: Diagnosis not present

## 2018-07-25 DIAGNOSIS — G540 Brachial plexus disorders: Secondary | ICD-10-CM | POA: Diagnosis not present

## 2018-07-25 DIAGNOSIS — F17210 Nicotine dependence, cigarettes, uncomplicated: Secondary | ICD-10-CM | POA: Diagnosis not present

## 2018-07-25 DIAGNOSIS — I252 Old myocardial infarction: Secondary | ICD-10-CM | POA: Insufficient documentation

## 2018-07-25 DIAGNOSIS — Z4682 Encounter for fitting and adjustment of non-vascular catheter: Secondary | ICD-10-CM | POA: Diagnosis not present

## 2018-07-25 DIAGNOSIS — Z09 Encounter for follow-up examination after completed treatment for conditions other than malignant neoplasm: Secondary | ICD-10-CM | POA: Diagnosis not present

## 2018-07-25 DIAGNOSIS — J449 Chronic obstructive pulmonary disease, unspecified: Secondary | ICD-10-CM | POA: Insufficient documentation

## 2018-07-25 HISTORY — PX: THORACIC OUTLET SURGERY: SHX2502

## 2018-09-11 DIAGNOSIS — J439 Emphysema, unspecified: Secondary | ICD-10-CM | POA: Diagnosis not present

## 2018-09-11 DIAGNOSIS — G2581 Restless legs syndrome: Secondary | ICD-10-CM | POA: Diagnosis not present

## 2018-09-11 DIAGNOSIS — J310 Chronic rhinitis: Secondary | ICD-10-CM | POA: Diagnosis not present

## 2018-09-20 DIAGNOSIS — R739 Hyperglycemia, unspecified: Secondary | ICD-10-CM | POA: Diagnosis not present

## 2018-09-20 DIAGNOSIS — I10 Essential (primary) hypertension: Secondary | ICD-10-CM | POA: Diagnosis not present

## 2018-09-20 DIAGNOSIS — E785 Hyperlipidemia, unspecified: Secondary | ICD-10-CM | POA: Diagnosis not present

## 2018-09-21 DIAGNOSIS — E785 Hyperlipidemia, unspecified: Secondary | ICD-10-CM | POA: Diagnosis not present

## 2018-09-21 DIAGNOSIS — I10 Essential (primary) hypertension: Secondary | ICD-10-CM | POA: Diagnosis not present

## 2018-09-21 DIAGNOSIS — Z87891 Personal history of nicotine dependence: Secondary | ICD-10-CM | POA: Diagnosis not present

## 2018-09-21 DIAGNOSIS — Z48812 Encounter for surgical aftercare following surgery on the circulatory system: Secondary | ICD-10-CM | POA: Diagnosis not present

## 2018-09-21 DIAGNOSIS — G540 Brachial plexus disorders: Secondary | ICD-10-CM | POA: Diagnosis not present

## 2018-10-03 DIAGNOSIS — Z23 Encounter for immunization: Secondary | ICD-10-CM | POA: Diagnosis not present

## 2018-10-03 DIAGNOSIS — E785 Hyperlipidemia, unspecified: Secondary | ICD-10-CM | POA: Diagnosis not present

## 2018-10-03 DIAGNOSIS — R7309 Other abnormal glucose: Secondary | ICD-10-CM | POA: Diagnosis not present

## 2018-10-03 DIAGNOSIS — I10 Essential (primary) hypertension: Secondary | ICD-10-CM | POA: Diagnosis not present

## 2018-11-02 DIAGNOSIS — J439 Emphysema, unspecified: Secondary | ICD-10-CM | POA: Diagnosis not present

## 2018-11-02 DIAGNOSIS — E785 Hyperlipidemia, unspecified: Secondary | ICD-10-CM | POA: Diagnosis not present

## 2018-11-02 DIAGNOSIS — I2510 Atherosclerotic heart disease of native coronary artery without angina pectoris: Secondary | ICD-10-CM | POA: Diagnosis not present

## 2018-11-02 DIAGNOSIS — F17200 Nicotine dependence, unspecified, uncomplicated: Secondary | ICD-10-CM | POA: Diagnosis not present

## 2018-11-02 DIAGNOSIS — E7800 Pure hypercholesterolemia, unspecified: Secondary | ICD-10-CM | POA: Diagnosis not present

## 2018-11-02 DIAGNOSIS — I10 Essential (primary) hypertension: Secondary | ICD-10-CM | POA: Diagnosis not present

## 2018-11-02 DIAGNOSIS — I214 Non-ST elevation (NSTEMI) myocardial infarction: Secondary | ICD-10-CM | POA: Diagnosis not present

## 2020-01-28 ENCOUNTER — Ambulatory Visit: Admit: 2020-01-28 | Payer: Medicare HMO | Admitting: Internal Medicine

## 2020-01-28 SURGERY — COLONOSCOPY WITH PROPOFOL
Anesthesia: General

## 2020-02-13 IMAGING — CT CT ANGIO EXTREM UP*L*
2 of 5 series · 12 of 46 positions shown, 14 images · IV contrast (APPLIED)
Comparison: CT chest 08/03/2016 and previous

CLINICAL DATA: left shoulder discomfort which started last night.
Pt stating his 3 fingers in his left hand progressively become "cool
and numb." Pt's 5th finger is abnormal in color and cool. Pt stating
decreased sensitivity in left versus right hand. Radial pulse
palpable and bounding.

EXAM:
CT ANGIOGRAPHY OF THE LEFT UPPEREXTREMITY
TECHNIQUE: Multidetector CT imaging of the left upperwas performed using the
standard protocol during bolus administration of intravenous
contrast. Multiplanar CT image reconstructions and MIPs were
obtained to evaluate the vascular anatomy.
CONTRAST:  100mL 12XPA8-VY1 IOPAMIDOL (12XPA8-VY1) INJECTION 76%

[Series 5: axial arterial · axial · arterial · 0.61mm/px · z∈[-790,-67]mm · 9 of 277 slices shown, 11 images]
[im 18/277  soft-tissue]
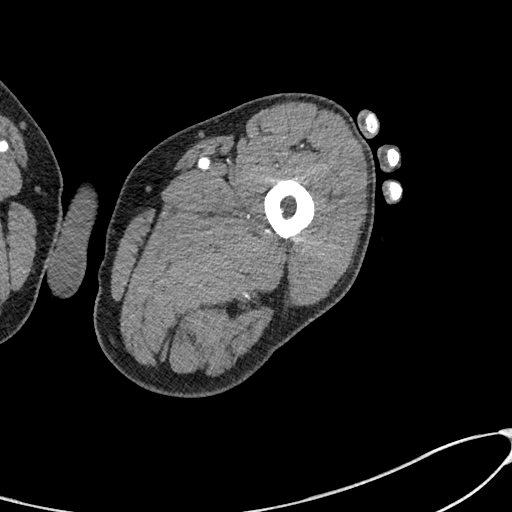
[im 18/277  bone]
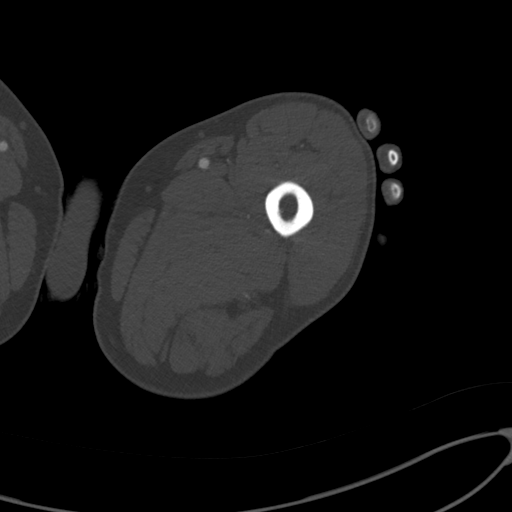
[im 54/277  soft-tissue]
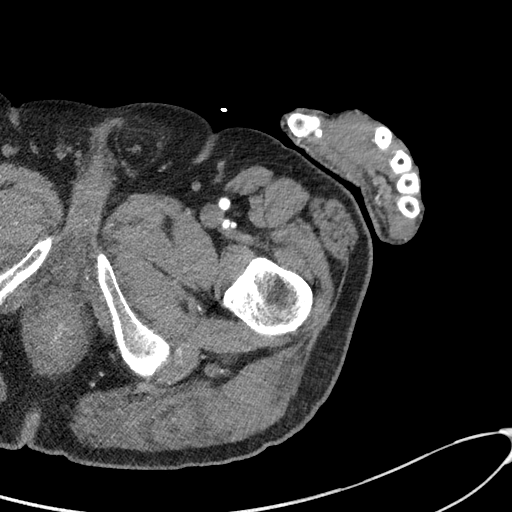
[im 81/277  soft-tissue]
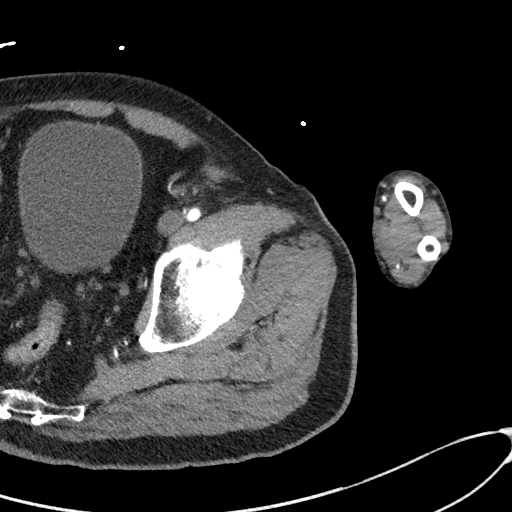
[im 107/277  soft-tissue]
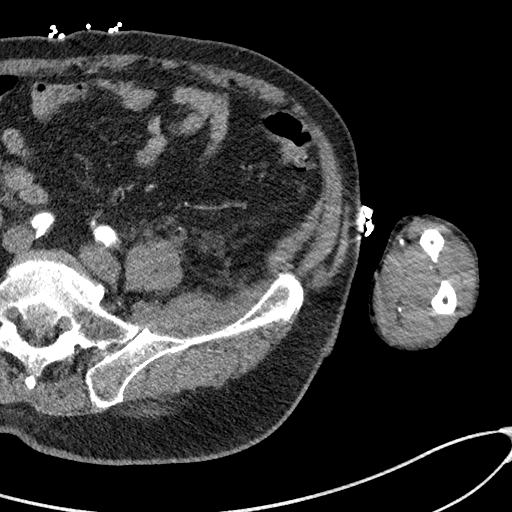
[im 143/277  soft-tissue]
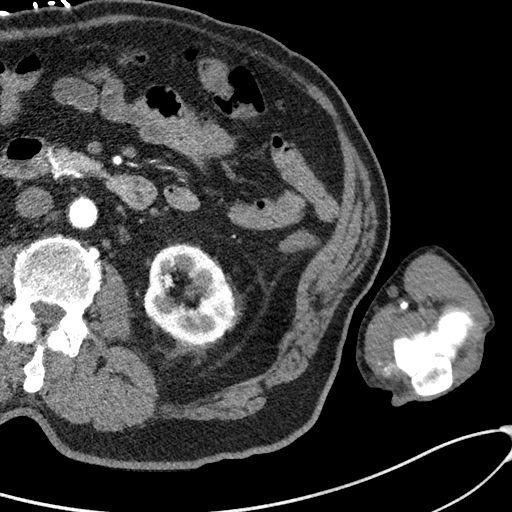
[im 170/277  soft-tissue]
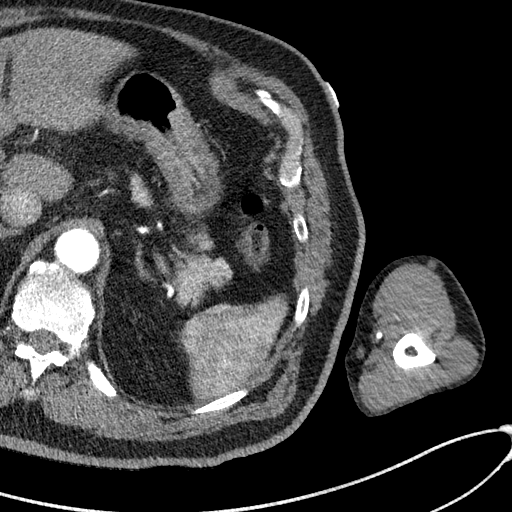
[im 196/277  soft-tissue]
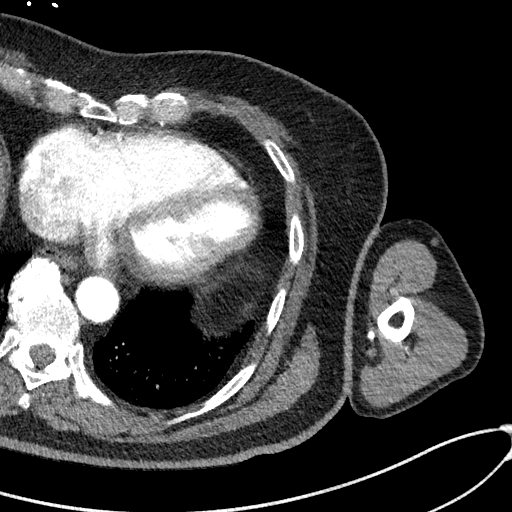
[im 232/277  soft-tissue]
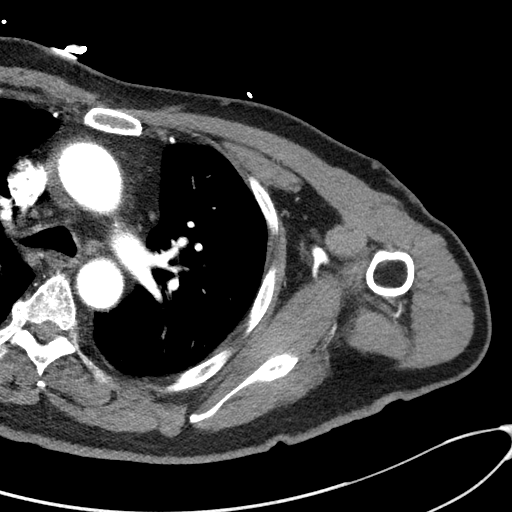
[im 259/277  soft-tissue]
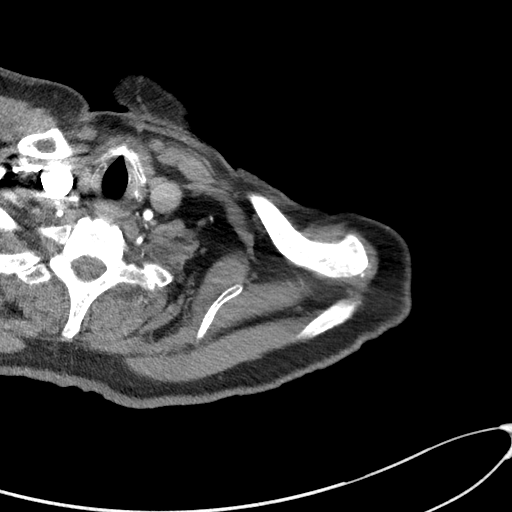
[im 259/277  bone]
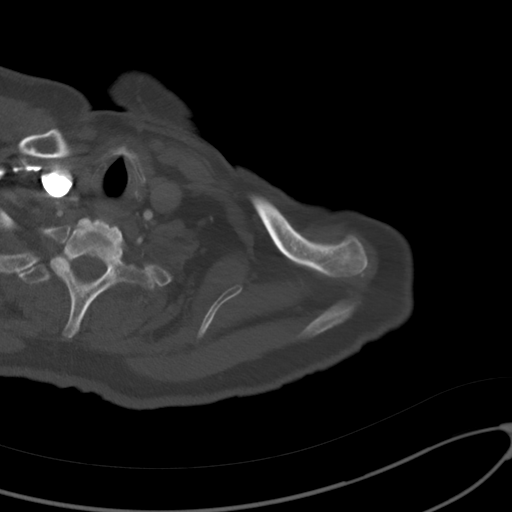

[Series 7: coronals · coronal · 0.65mm/px · 3 of 128 slices shown]
[im 32/128  soft-tissue]
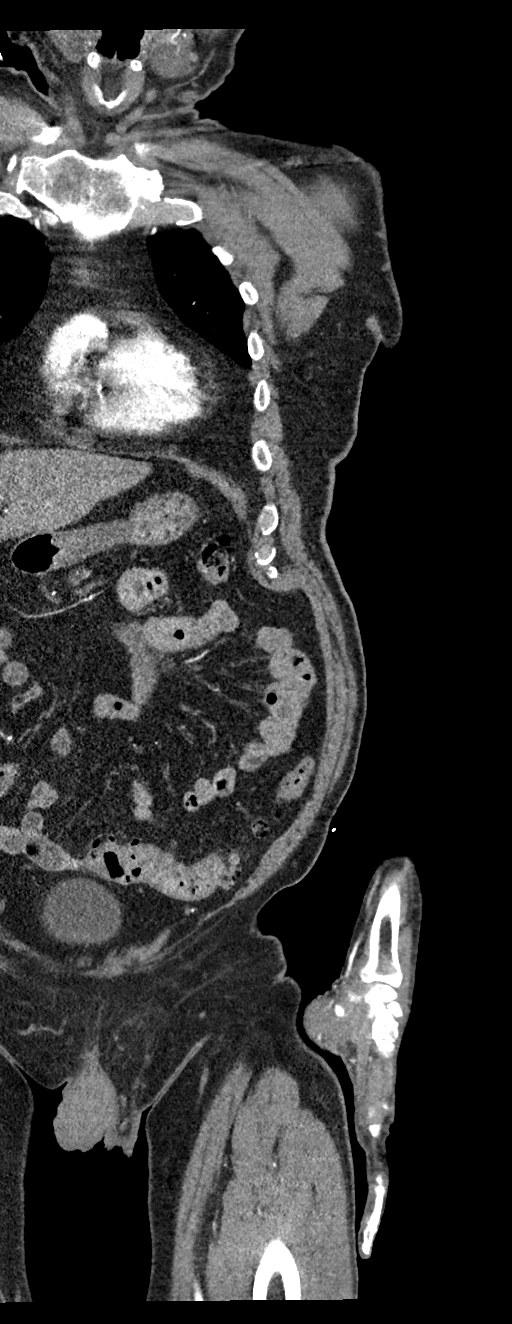
[im 64/128  soft-tissue]
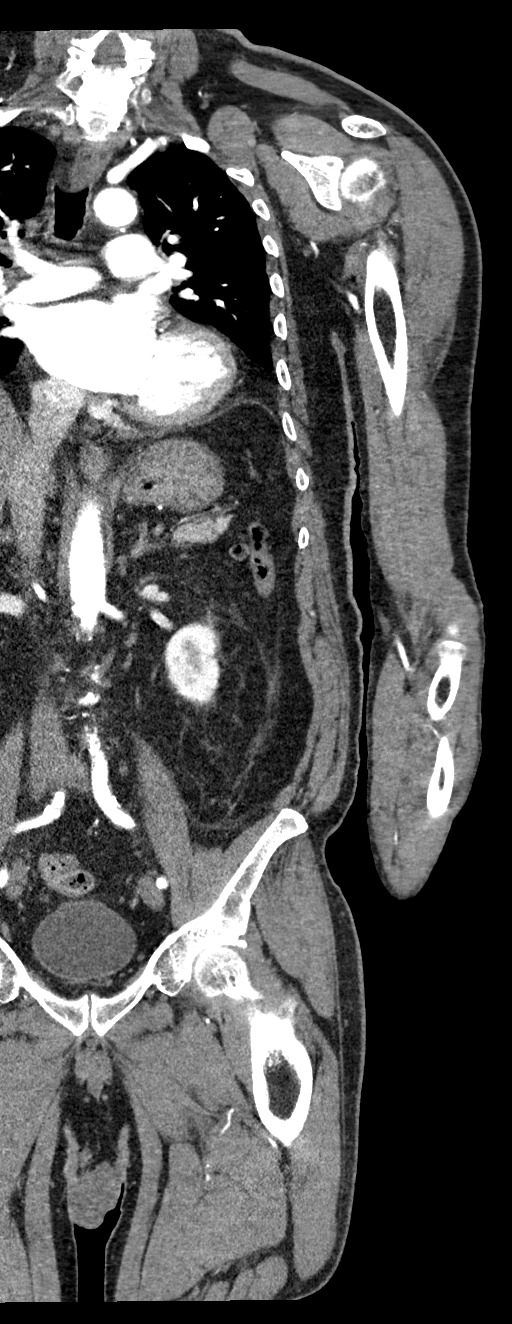
[im 96/128  soft-tissue]
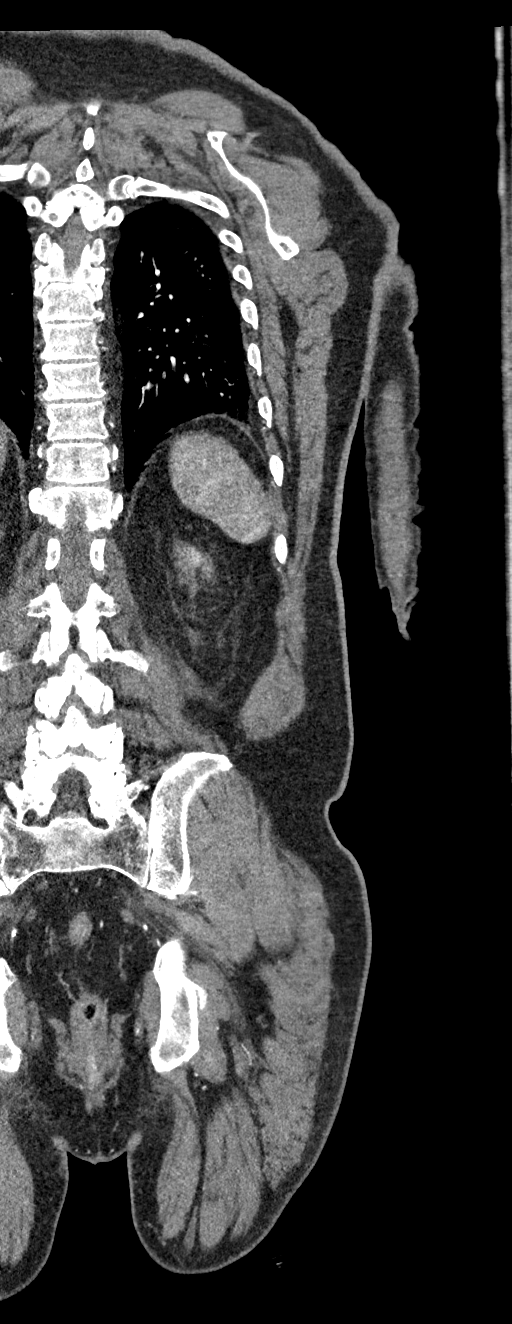

[12 of 46 positions shown; findings below may reference images not displayed]

FINDINGS: Vascular:

Scattered coronary calcifications.

Thoracic aorta unremarkable. No aneurysm, dissection, stenosis, or
significant atheromatous plaque.

Classic 3 vessel brachiocephalic arterial origin anatomy without
proximal stenosis.

Left subclavian artery is patent, with mild short-segment narrowing
at the thoracic inlet. Left axillary and brachial arteries widely
patent. Ulnar and radial arteries both patent across the wrist.
Diminutive interosseous artery is patent in the proximal forearm.
Limited evaluation of palmar arches.

Calcified atheromatous plaque in the abdominal aorta without
aneurysm.

Short-segment origin stenosis of the celiac axis at the level of the
median arcuate ligament of the diaphragm.

Calcified plaque in bilateral common iliac arteries without
high-grade stenosis.

Limited venous evaluation due to arterial phase contrast
enhancement.

Nonvascular:

No mediastinal mass or adenopathy is identified. Unremarkable lower
neck and axilla. No significant osseous or soft tissue abnormality
in the left upper extremity.

Vertebral endplate spurring at multiple levels in the mid and lower
thoracic spine. Multilevel spondylitic changes in the lumbar spine
most marked L5-S1. Left lung and visualized portions of right lung
unremarkable. Descending and sigmoid diverticula noted. Mild
prostatic enlargement. Remainder visualized portions of abdomen and
pelvis unremarkable.

Review of the MIP images confirms the above findings.
IMPRESSION: 1. Short-segment mild narrowing of the subclavian artery at the
level of thoracic inlet.
2. Otherwise unremarkable left upper extremity evaluation.
3. Coronary and aortoiliac atherosclerosis ([id number]-170.0).

## 2020-02-25 ENCOUNTER — Other Ambulatory Visit
Admission: RE | Admit: 2020-02-25 | Discharge: 2020-02-25 | Disposition: A | Discharge status: Home / Self Care | Payer: Medicare HMO | Source: Ambulatory Visit | Attending: Internal Medicine | Admitting: Internal Medicine

## 2020-02-25 DIAGNOSIS — Z01812 Encounter for preprocedural laboratory examination: Secondary | ICD-10-CM | POA: Diagnosis present

## 2020-02-25 DIAGNOSIS — Z20822 Contact with and (suspected) exposure to covid-19: Secondary | ICD-10-CM | POA: Diagnosis not present

## 2020-02-26 ENCOUNTER — Other Ambulatory Visit: Payer: Medicare HMO

## 2020-02-26 LAB — SARS CORONAVIRUS 2 (TAT 6-24 HRS): SARS Coronavirus 2: NEGATIVE

## 2020-02-27 ENCOUNTER — Encounter: Payer: Self-pay | Admitting: Internal Medicine

## 2020-02-28 ENCOUNTER — Ambulatory Visit: Payer: Medicare HMO | Admitting: Certified Registered Nurse Anesthetist

## 2020-02-28 ENCOUNTER — Encounter
Admission: RE | Disposition: A | Discharge status: Home / Self Care | Payer: Self-pay | Source: Ambulatory Visit | Attending: Internal Medicine

## 2020-02-28 ENCOUNTER — Encounter: Payer: Self-pay | Admitting: Internal Medicine

## 2020-02-28 ENCOUNTER — Ambulatory Visit
Admission: RE | Admit: 2020-02-28 | Discharge: 2020-02-28 | Disposition: A | Discharge status: Home / Self Care | Payer: Medicare HMO | Source: Ambulatory Visit | Attending: Internal Medicine | Admitting: Internal Medicine

## 2020-02-28 DIAGNOSIS — Z791 Long term (current) use of non-steroidal anti-inflammatories (NSAID): Secondary | ICD-10-CM | POA: Insufficient documentation

## 2020-02-28 DIAGNOSIS — Z951 Presence of aortocoronary bypass graft: Secondary | ICD-10-CM | POA: Insufficient documentation

## 2020-02-28 DIAGNOSIS — I10 Essential (primary) hypertension: Secondary | ICD-10-CM | POA: Insufficient documentation

## 2020-02-28 DIAGNOSIS — Z86010 Personal history of colonic polyps: Secondary | ICD-10-CM | POA: Insufficient documentation

## 2020-02-28 DIAGNOSIS — K5730 Diverticulosis of large intestine without perforation or abscess without bleeding: Secondary | ICD-10-CM | POA: Insufficient documentation

## 2020-02-28 DIAGNOSIS — I252 Old myocardial infarction: Secondary | ICD-10-CM | POA: Insufficient documentation

## 2020-02-28 DIAGNOSIS — Z79899 Other long term (current) drug therapy: Secondary | ICD-10-CM | POA: Insufficient documentation

## 2020-02-28 DIAGNOSIS — Z7982 Long term (current) use of aspirin: Secondary | ICD-10-CM | POA: Insufficient documentation

## 2020-02-28 DIAGNOSIS — Z09 Encounter for follow-up examination after completed treatment for conditions other than malignant neoplasm: Secondary | ICD-10-CM | POA: Diagnosis not present

## 2020-02-28 DIAGNOSIS — J449 Chronic obstructive pulmonary disease, unspecified: Secondary | ICD-10-CM | POA: Diagnosis not present

## 2020-02-28 DIAGNOSIS — G2581 Restless legs syndrome: Secondary | ICD-10-CM | POA: Diagnosis not present

## 2020-02-28 DIAGNOSIS — E785 Hyperlipidemia, unspecified: Secondary | ICD-10-CM | POA: Diagnosis not present

## 2020-02-28 DIAGNOSIS — Z859 Personal history of malignant neoplasm, unspecified: Secondary | ICD-10-CM | POA: Diagnosis not present

## 2020-02-28 DIAGNOSIS — K640 First degree hemorrhoids: Secondary | ICD-10-CM | POA: Insufficient documentation

## 2020-02-28 DIAGNOSIS — E1151 Type 2 diabetes mellitus with diabetic peripheral angiopathy without gangrene: Secondary | ICD-10-CM | POA: Insufficient documentation

## 2020-02-28 DIAGNOSIS — Z880 Allergy status to penicillin: Secondary | ICD-10-CM | POA: Insufficient documentation

## 2020-02-28 DIAGNOSIS — I2510 Atherosclerotic heart disease of native coronary artery without angina pectoris: Secondary | ICD-10-CM | POA: Diagnosis not present

## 2020-02-28 HISTORY — DX: Restless legs syndrome: G25.81

## 2020-02-28 HISTORY — DX: Polyp of colon: K63.5

## 2020-02-28 HISTORY — DX: Gastric ulcer, unspecified as acute or chronic, without hemorrhage or perforation: K25.9

## 2020-02-28 HISTORY — DX: Allergic rhinitis, unspecified: J30.9

## 2020-02-28 HISTORY — DX: Carcinoma in situ, unspecified: D09.9

## 2020-02-28 HISTORY — PX: COLONOSCOPY WITH PROPOFOL: SHX5780

## 2020-02-28 HISTORY — DX: Bladder-neck obstruction: N32.0

## 2020-02-28 SURGERY — COLONOSCOPY WITH PROPOFOL
Anesthesia: General

## 2020-02-28 MED ORDER — LIDOCAINE HCL (CARDIAC) PF 100 MG/5ML IV SOSY
PREFILLED_SYRINGE | INTRAVENOUS | Status: DC | PRN
  Administered 2020-02-28: 50 mg via INTRAVENOUS

## 2020-02-28 MED ORDER — PROPOFOL 10 MG/ML IV BOLUS
INTRAVENOUS | Status: DC | PRN
  Administered 2020-02-28: 30 mg via INTRAVENOUS
  Administered 2020-02-28: 50 mg via INTRAVENOUS

## 2020-02-28 MED ORDER — PROPOFOL 500 MG/50ML IV EMUL
INTRAVENOUS | Status: DC | PRN
  Administered 2020-02-28: 175 ug/kg/min via INTRAVENOUS

## 2020-02-28 MED ORDER — PROPOFOL 500 MG/50ML IV EMUL
INTRAVENOUS | Status: AC
  Filled 2020-02-28: qty 50

## 2020-02-28 MED ORDER — SODIUM CHLORIDE 0.9 % IV SOLN: INTRAVENOUS | Status: DC

## 2020-02-28 NOTE — H&P (Signed)
Outpatient short stay form Pre-procedure 02/28/2020 11:13 AM Alaria Oconnor K. Alice Reichert, M.D.  Primary Physician: Adrian Prows, M.D.  Reason for visit: Personal hx of adenomatous colon polyps x 4 (2017)  History of present illness:                            Patient presents for colonoscopy for a personal hx of colon polyps. The patient denies abdominal pain, abnormal weight loss or rectal bleeding.      Current Facility-Administered Medications:  .  0.9 %  sodium chloride infusion, , Intravenous, Continuous, [place], Benay Pike, MD, Last Rate: 20 mL/hr at 02/28/20 0939, New Bag at 02/28/20 0939  Medications Prior to Admission  Medication Sig Dispense Refill Last Dose  . aspirin EC 81 MG tablet Take 81 mg by mouth daily.   Past Week at Unknown time  . atorvastatin (LIPITOR) 40 MG tablet Take 40 mg by mouth daily.   Past Week at Unknown time  . diclofenac Sodium (VOLTAREN) 1 % GEL Apply topically 4 (four) times daily.     . fluticasone (FLONASE) 50 MCG/ACT nasal spray Place into both nostrils daily.   Past Week at Unknown time  . hydrochlorothiazide (HYDRODIURIL) 25 MG tablet Take 25 mg by mouth daily.   02/27/2020 at Unknown time  . losartan (COZAAR) 100 MG tablet Take 100 mg by mouth daily.   02/28/2020 at 0630  . MULTIPLE MINERALS PO Take by mouth.   Past Week at Unknown time  . vitamin B-12 (CYANOCOBALAMIN) 1000 MCG tablet Take 1,000 mcg by mouth daily.   Past Week at Unknown time     Allergies  Allergen Reactions  . Penicillins Hives    Has patient had a PCN reaction causing immediate rash, facial/tongue/throat swelling, SOB or lightheadedness with hypotension: Yes Has patient had a PCN reaction causing severe rash involving mucus membranes or skin necrosis: No Has patient had a PCN reaction that required hospitalization: No Has patient had a PCN reaction occurring within the last 10 years: No If all of the above answers are "NO", then may proceed with Cephalosporin use.      Past  Medical History:  Diagnosis Date  . Allergic rhinitis   . Bladder-neck obstruction   . Carotid stenosis   . Colon polyp   . COPD (chronic obstructive pulmonary disease) ([place])   . Coronary artery disease   . Diabetes mellitus without complication ([place])    NO medications (patient state borderline diabetes)  . Gastric ulcer   . Hyperlipidemia   . Hypertension   . Myocardial infarction ([place])   . Personal history of tobacco use, presenting hazards to health 05/20/2016  . Pulmonary granuloma ([place])   . Pulmonary granuloma ([place])   . Restless leg syndrome   . Restless legs syndrome (RLS)   . Squamous cell carcinoma in situ   . Tubular adenoma of colon   . Tubular adenoma of colon     Review of systems:  Otherwise negative.    Physical Exam  Gen: Alert, oriented. Appears stated age.  HEENT: [place]/AT. PERRLA. Lungs: CTA, no wheezes. CV: RR nl S1, S2. Abd: soft, benign, no masses. BS+ Ext: No edema. Pulses 2+    Planned procedures: Proceed with colonoscopy. The patient understands the nature of the planned procedure, indications, risks, alternatives and potential complications including but not limited to bleeding, infection, perforation, damage to internal organs and possible oversedation/side effects from anesthesia. The patient agrees and gives  consent to proceed.  Please refer to procedure notes for findings, recommendations and patient disposition/instructions.     Donyel Nester K. Alice Reichert, M.D. Gastroenterology 02/28/2020  11:13 AM

## 2020-02-28 NOTE — Op Note (Signed)
[facility] Gastroenterology Patient Name: Seth Rivera Procedure Date: 02/28/2020 11:15 AM MRN: [id number] Account #: [id number] Date of Birth: 1946-02-25 Admit Type: Outpatient Age: 74 Room: [facility] ENDO ROOM 1 Gender: Male Note Status: Finalized Procedure:             Colonoscopy Indications:           Surveillance: Personal history of adenomatous polyps                         on last colonoscopy > 3 years ago Providers:             Lorie Apley K. Alice Reichert MD, MD Referring MD:          Leona Carry. Hall Busing, MD (Referring MD) Medicines:             Propofol per Anesthesia Complications:         No immediate complications. Procedure:             Pre-Anesthesia Assessment:                        - The risks and benefits of the procedure and the                         sedation options and risks were discussed with the                         patient. All questions were answered and informed                         consent was obtained.                        - Patient identification and proposed procedure were                         verified prior to the procedure by the nurse. The                         procedure was verified in the procedure room.                        - ASA Grade Assessment: III - A patient with severe                         systemic disease.                        - After reviewing the risks and benefits, the patient                         was deemed in satisfactory condition to undergo the                         procedure.                        After obtaining informed consent, the colonoscope was                         passed under direct  vision. Throughout the procedure,                         the patient's blood pressure, pulse, and oxygen                         saturations were monitored continuously. The                         Colonoscope was introduced through the anus and                         advanced to the the cecum, identified by  appendiceal                         orifice and ileocecal valve. The colonoscopy was                         performed without difficulty. The patient tolerated                         the procedure well. The quality of the bowel                         preparation was good. The ileocecal valve, appendiceal                         orifice, and rectum were photographed. Findings:      The perianal and digital rectal examinations were normal. Pertinent       negatives include normal sphincter tone and no palpable rectal lesions.      Many medium-mouthed diverticula were found in the sigmoid colon.      Non-bleeding internal hemorrhoids were found during retroflexion. The       hemorrhoids were Grade I (internal hemorrhoids that do not prolapse).      The exam was otherwise without abnormality. Impression:            - Diverticulosis in the sigmoid colon.                        - Non-bleeding internal hemorrhoids.                        - The examination was otherwise normal.                        - No specimens collected. Recommendation:        - Patient has a contact number available for                         emergencies. The signs and symptoms of potential                         delayed complications were discussed with the patient.                         Return to normal activities tomorrow. Written                         discharge  instructions were provided to the patient.                        - Resume previous diet.                        - Continue present medications.                        - No repeat colonoscopy due to current age (60 years                         or older) and the absence of advanced adenomas.                        - The findings and recommendations were discussed with                         the patient. Procedure Code(s):     --- Professional ---                        [id number], Colorectal cancer screening; colonoscopy on                          individual at high risk Diagnosis Code(s):     --- Professional ---                        K57.30, Diverticulosis of large intestine without                         perforation or abscess without bleeding                        K64.0, First degree hemorrhoids                        Z86.010, Personal history of colonic polyps CPT copyright 2019 [employer]. All rights reserved. The codes documented in this report are preliminary and upon coder review may  be revised to meet current compliance requirements. Efrain Sella MD, MD 02/28/2020 11:32:58 AM This report has been signed electronically. Number of Addenda: 0 Note Initiated On: 02/28/2020 11:15 AM Scope Withdrawal Time: 0 hours 2 minutes 48 seconds  Total Procedure Duration: 0 hours 6 minutes 26 seconds  Estimated Blood Loss:  Estimated blood loss: none.      [facility]

## 2020-02-28 NOTE — Anesthesia Preprocedure Evaluation (Signed)
Anesthesia Evaluation  Patient identified by MRN, date of birth, ID band Patient awake    Reviewed: Allergy & Precautions, H&P , NPO status , Patient's Chart, lab work & pertinent test results  Airway Mallampati: II  TM Distance: >3 FB Neck ROM: full    Dental  (+) Chipped   Pulmonary COPD, former smoker,           Cardiovascular hypertension, + CAD, + Past MI and + Peripheral Vascular Disease  CABG: 2003.       Neuro/Psych negative neurological ROS  negative psych ROS   GI/Hepatic Neg liver ROS, PUD,   Endo/Other  diabetes  Renal/GU negative Renal ROS  negative genitourinary   Musculoskeletal   Abdominal   Peds  Hematology negative hematology ROS (+)   Anesthesia Other Findings Past Medical History: No date: Allergic rhinitis No date: Bladder-neck obstruction No date: Carotid stenosis No date: Colon polyp No date: COPD (chronic obstructive pulmonary disease) (HCC) No date: Coronary artery disease No date: Diabetes mellitus without complication (HCC)     Comment:  NO medications (patient state borderline diabetes) No date: Gastric ulcer No date: Hyperlipidemia No date: Hypertension No date: Myocardial infarction ([place]) 05/20/2016: Personal history of tobacco use, presenting hazards to  health No date: Pulmonary granuloma (HCC) No date: Pulmonary granuloma (HCC) No date: Restless leg syndrome No date: Restless legs syndrome (RLS) No date: Squamous cell carcinoma in situ No date: Tubular adenoma of colon No date: Tubular adenoma of colon  Past Surgical History: No date: CAROTID ENDARTERECTOMY 09/06/2016: COLONOSCOPY WITH PROPOFOL; N/A     Comment:  Procedure: COLONOSCOPY WITH PROPOFOL;  Surgeon: Manya Silvas, MD;  Location: [facility] ENDOSCOPY;  Service:               Endoscopy;  Laterality: N/A; No date: CORONARY ANGIOPLASTY No date: CORONARY ANGIOPLASTY  BMI    Body Mass Index: 27.98  kg/m      Reproductive/Obstetrics negative OB ROS                            Anesthesia Physical Anesthesia Plan  ASA: III  Anesthesia Plan: General   Post-op Pain Management:    Induction:   PONV Risk Score and Plan: Propofol infusion and TIVA  Airway Management Planned:   Additional Equipment:   Intra-op Plan:   Post-operative Plan:   Informed Consent: I have reviewed the patients History and Physical, chart, labs and discussed the procedure including the risks, benefits and alternatives for the proposed anesthesia with the patient or authorized representative who has indicated his/her understanding and acceptance.     Dental Advisory Given  Plan Discussed with: Anesthesiologist  Anesthesia Plan Comments:        Anesthesia Quick Evaluation

## 2020-02-28 NOTE — Anesthesia Postprocedure Evaluation (Signed)
Anesthesia Post Note  Patient: Seth Rivera  Procedure(s) Performed: COLONOSCOPY WITH PROPOFOL (N/A )  Patient location during evaluation: PACU Anesthesia Type: General Level of consciousness: awake and alert Pain management: pain level controlled Vital Signs Assessment: post-procedure vital signs reviewed and stable Respiratory status: spontaneous breathing, nonlabored ventilation and respiratory function stable Cardiovascular status: blood pressure returned to baseline and stable Postop Assessment: no apparent nausea or vomiting Anesthetic complications: no     Last Vitals:  Vitals:   02/28/20 1156 02/28/20 1206  BP: (!) 108/92 128/68  Pulse: 73 65  Resp: (!) 21 16  Temp:    SpO2: 100% 100%    Last Pain:  Vitals:   02/28/20 1206  TempSrc:   PainSc: 0-No pain                 Tera Mater

## 2020-02-28 NOTE — Transfer of Care (Signed)
Immediate Anesthesia Transfer of Care Note  Patient: Seth Rivera  Procedure(s) Performed: COLONOSCOPY WITH PROPOFOL (N/A )  Patient Location: PACU  Anesthesia Type:General  Level of Consciousness: sedated  Airway & Oxygen Therapy: Patient Spontanous Breathing  Post-op Assessment: Report given to RN and Post -op Vital signs reviewed and stable  Post vital signs: Reviewed and stable  Last Vitals:  Vitals Value Taken Time  BP 106/64 02/28/20 1136  Temp 36 C 02/28/20 1136  Pulse 72 02/28/20 1137  Resp 15 02/28/20 1137  SpO2 97 % 02/28/20 1137  Vitals shown include unvalidated device data.  Last Pain:  Vitals:   02/28/20 1136  TempSrc: Temporal  PainSc:          Complications: No apparent anesthesia complications

## 2020-02-28 NOTE — Anesthesia Procedure Notes (Signed)
Date/Time: 02/28/2020 11:20 AM Performed by: Johnna Acosta, CRNA Pre-anesthesia Checklist: Patient identified, Emergency Drugs available, Suction available, Patient being monitored and Timeout performed Patient Re-evaluated:Patient Re-evaluated prior to induction Oxygen Delivery Method: Nasal cannula Preoxygenation: Pre-oxygenation with 100% oxygen Induction Type: IV induction

## 2020-02-28 NOTE — Interval H&P Note (Signed)
History and Physical Interval Note:  02/28/2020 11:13 AM  Seth Rivera  has presented today for surgery, with the diagnosis of [place].  The various methods of treatment have been discussed with the patient and family. After consideration of risks, benefits and other options for treatment, the patient has consented to  Procedure(s): COLONOSCOPY WITH PROPOFOL (N/A) as a surgical intervention.  The patient's history has been reviewed, patient examined, no change in status, stable for surgery.  I have reviewed the patient's chart and labs.  Questions were answered to the patient's satisfaction.     [place], [place]

## 2020-02-29 ENCOUNTER — Encounter: Payer: Self-pay | Admitting: *Deleted

## 2021-03-12 DIAGNOSIS — Z8249 Family history of ischemic heart disease and other diseases of the circulatory system: Secondary | ICD-10-CM | POA: Diagnosis not present

## 2021-03-12 DIAGNOSIS — I10 Essential (primary) hypertension: Secondary | ICD-10-CM | POA: Diagnosis not present

## 2021-03-12 DIAGNOSIS — Z809 Family history of malignant neoplasm, unspecified: Secondary | ICD-10-CM | POA: Diagnosis not present

## 2021-03-12 DIAGNOSIS — Z7982 Long term (current) use of aspirin: Secondary | ICD-10-CM | POA: Diagnosis not present

## 2021-03-12 DIAGNOSIS — Z880 Allergy status to penicillin: Secondary | ICD-10-CM | POA: Diagnosis not present

## 2021-03-12 DIAGNOSIS — E785 Hyperlipidemia, unspecified: Secondary | ICD-10-CM | POA: Diagnosis not present

## 2021-03-12 DIAGNOSIS — Z7722 Contact with and (suspected) exposure to environmental tobacco smoke (acute) (chronic): Secondary | ICD-10-CM | POA: Diagnosis not present

## 2021-03-12 DIAGNOSIS — I252 Old myocardial infarction: Secondary | ICD-10-CM | POA: Diagnosis not present

## 2021-03-12 DIAGNOSIS — Z87891 Personal history of nicotine dependence: Secondary | ICD-10-CM | POA: Diagnosis not present

## 2021-07-21 DIAGNOSIS — R972 Elevated prostate specific antigen [PSA]: Secondary | ICD-10-CM | POA: Diagnosis not present

## 2021-07-21 DIAGNOSIS — E785 Hyperlipidemia, unspecified: Secondary | ICD-10-CM | POA: Diagnosis not present

## 2021-07-21 DIAGNOSIS — E119 Type 2 diabetes mellitus without complications: Secondary | ICD-10-CM | POA: Diagnosis not present

## 2021-07-21 DIAGNOSIS — I2510 Atherosclerotic heart disease of native coronary artery without angina pectoris: Secondary | ICD-10-CM | POA: Diagnosis not present

## 2021-07-21 DIAGNOSIS — J439 Emphysema, unspecified: Secondary | ICD-10-CM | POA: Diagnosis not present

## 2021-07-28 DIAGNOSIS — E785 Hyperlipidemia, unspecified: Secondary | ICD-10-CM | POA: Diagnosis not present

## 2021-07-28 DIAGNOSIS — R972 Elevated prostate specific antigen [PSA]: Secondary | ICD-10-CM | POA: Diagnosis not present

## 2021-07-28 DIAGNOSIS — I10 Essential (primary) hypertension: Secondary | ICD-10-CM | POA: Diagnosis not present

## 2021-07-28 DIAGNOSIS — E119 Type 2 diabetes mellitus without complications: Secondary | ICD-10-CM | POA: Diagnosis not present

## 2021-12-09 DIAGNOSIS — E785 Hyperlipidemia, unspecified: Secondary | ICD-10-CM | POA: Diagnosis not present

## 2021-12-09 DIAGNOSIS — R202 Paresthesia of skin: Secondary | ICD-10-CM | POA: Diagnosis not present

## 2021-12-09 DIAGNOSIS — R0989 Other specified symptoms and signs involving the circulatory and respiratory systems: Secondary | ICD-10-CM | POA: Diagnosis not present

## 2021-12-09 DIAGNOSIS — E119 Type 2 diabetes mellitus without complications: Secondary | ICD-10-CM | POA: Diagnosis not present

## 2021-12-09 DIAGNOSIS — M79672 Pain in left foot: Secondary | ICD-10-CM | POA: Diagnosis not present

## 2021-12-09 DIAGNOSIS — M79671 Pain in right foot: Secondary | ICD-10-CM | POA: Diagnosis not present

## 2022-01-12 ENCOUNTER — Other Ambulatory Visit (INDEPENDENT_AMBULATORY_CARE_PROVIDER_SITE_OTHER): Payer: Self-pay | Admitting: Vascular Surgery

## 2022-01-12 DIAGNOSIS — M79661 Pain in right lower leg: Secondary | ICD-10-CM

## 2022-01-13 ENCOUNTER — Ambulatory Visit (INDEPENDENT_AMBULATORY_CARE_PROVIDER_SITE_OTHER): Payer: Medicare HMO | Admitting: Nurse Practitioner

## 2022-01-13 ENCOUNTER — Ambulatory Visit (INDEPENDENT_AMBULATORY_CARE_PROVIDER_SITE_OTHER): Payer: Medicare HMO

## 2022-01-13 ENCOUNTER — Encounter (INDEPENDENT_AMBULATORY_CARE_PROVIDER_SITE_OTHER): Payer: Self-pay | Admitting: Nurse Practitioner

## 2022-01-13 VITALS — BP 183/75 | HR 63 | Resp 16 | Ht 70.0 in | Wt 184.0 lb

## 2022-01-13 DIAGNOSIS — I10 Essential (primary) hypertension: Secondary | ICD-10-CM

## 2022-01-13 DIAGNOSIS — I6523 Occlusion and stenosis of bilateral carotid arteries: Secondary | ICD-10-CM | POA: Diagnosis not present

## 2022-01-13 DIAGNOSIS — M79662 Pain in left lower leg: Secondary | ICD-10-CM

## 2022-01-13 DIAGNOSIS — D126 Benign neoplasm of colon, unspecified: Secondary | ICD-10-CM | POA: Insufficient documentation

## 2022-01-13 DIAGNOSIS — J309 Allergic rhinitis, unspecified: Secondary | ICD-10-CM | POA: Insufficient documentation

## 2022-01-13 DIAGNOSIS — G2581 Restless legs syndrome: Secondary | ICD-10-CM | POA: Insufficient documentation

## 2022-01-13 DIAGNOSIS — R208 Other disturbances of skin sensation: Secondary | ICD-10-CM

## 2022-01-13 DIAGNOSIS — N320 Bladder-neck obstruction: Secondary | ICD-10-CM | POA: Insufficient documentation

## 2022-01-13 DIAGNOSIS — J8410 Pulmonary fibrosis, unspecified: Secondary | ICD-10-CM | POA: Insufficient documentation

## 2022-01-13 DIAGNOSIS — I219 Acute myocardial infarction, unspecified: Secondary | ICD-10-CM | POA: Insufficient documentation

## 2022-01-13 DIAGNOSIS — M79661 Pain in right lower leg: Secondary | ICD-10-CM | POA: Diagnosis not present

## 2022-01-13 DIAGNOSIS — IMO0002 Reserved for concepts with insufficient information to code with codable children: Secondary | ICD-10-CM | POA: Insufficient documentation

## 2022-01-13 DIAGNOSIS — Z8711 Personal history of peptic ulcer disease: Secondary | ICD-10-CM | POA: Insufficient documentation

## 2022-01-13 NOTE — Progress Notes (Signed)
Subjective:    Patient ID: Seth Rivera, male    DOB: [birth year], 76 y.o.   MRN: [id number] Chief Complaint  Patient presents with   Follow-up    ultrasound    Seth Rivera is a 76 year old male that presents today for evaluation for possible peripheral arterial disease.  The patient does have a previous history of carotid artery stenosis with a right carotid endarterectomy.  The patient notes that for approximately the last 3 months he has been having intense burning and stinging in his lower extremities.  He notes that this pain and discomfort is worse at night, however it improves when he walks and moves.  Given his risk factors including smoking, diabetes, hypertension and hyperlipidemia he was concern for possible peripheral arterial disease.  He denies any claudication-like symptoms.  He denies any open wounds or ulcerations.  Today the patient has an ABI of 1.06 on the right and 1.10 on the left.  He has triphasic tibial artery waveforms bilaterally with normal toe waveforms bilaterally.  He also has a TBI of 0.92 on the right and 1.00 on the left.  Essentially the patient's ABIs are normal.   Review of Systems  Neurological:  Positive for numbness.  All other systems reviewed and are negative.     Objective:   Physical Exam Vitals reviewed.  HENT:     Head: Normocephalic.  Cardiovascular:     Rate and Rhythm: Normal rate.  Pulmonary:     Effort: Pulmonary effort is normal.  Skin:    General: Skin is warm and dry.  Neurological:     Mental Status: He is alert and oriented to person, place, and time.  Psychiatric:        Mood and Affect: Mood normal.        Behavior: Behavior normal.        Thought Content: Thought content normal.        Judgment: Judgment normal.    BP (!) 183/75 (BP Location: Left Arm)    Pulse 63    Resp 16    Ht 5\' 10"  (1.778 m)    Wt 184 lb (83.5 kg)    BMI 26.40 kg/m   Past Medical History:  Diagnosis Date   Allergic rhinitis    Bladder-neck  obstruction    Carotid stenosis    Colon polyp    COPD (chronic obstructive pulmonary disease) (HCC)    Coronary artery disease    Diabetes mellitus without complication (HCC)    NO medications (patient state borderline diabetes)   Gastric ulcer    Hyperlipidemia    Hypertension    Myocardial infarction [facility])    Personal history of tobacco use, presenting hazards to health 05/20/2016   Pulmonary granuloma (HCC)    Pulmonary granuloma (HCC)    Restless leg syndrome    Restless legs syndrome (RLS)    Squamous cell carcinoma in situ    Tubular adenoma of colon    Tubular adenoma of colon     Social History   Socioeconomic History   Marital status: Married    Spouse name: Not on file   Number of children: Not on file   Years of education: Not on file   Highest education level: Not on file  Occupational History   Not on file  Tobacco Use   Smoking status: Some Days    Types: Cigarettes   Smokeless tobacco: Never  Substance and Sexual Activity   Alcohol use: No  Drug use: No   Sexual activity: Not on file  Other Topics Concern   Not on file  Social History Narrative   Not on file   Social Determinants of Health   Financial Resource Strain: Not on file  Food Insecurity: Not on file  Transportation Needs: Not on file  Physical Activity: Not on file  Stress: Not on file  Social Connections: Not on file  Intimate Partner Violence: Not on file    Past Surgical History:  Procedure Laterality Date   CAROTID ENDARTERECTOMY     COLONOSCOPY WITH PROPOFOL N/A 09/06/2016   Procedure: COLONOSCOPY WITH PROPOFOL;  Surgeon: Manya Silvas, MD;  Location: [facility] ENDOSCOPY;  Service: Endoscopy;  Laterality: N/A;   COLONOSCOPY WITH PROPOFOL N/A 02/28/2020   Procedure: COLONOSCOPY WITH PROPOFOL;  Surgeon: Toledo, Benay Pike, MD;  Location: [facility] ENDOSCOPY;  Service: Gastroenterology;  Laterality: N/A;   CORONARY ANGIOPLASTY     CORONARY ANGIOPLASTY      Family History  Problem  Relation Age of Onset   Lymphoma Mother    Lung cancer Father    Heart attack Brother    Diabetes Brother     Allergies  Allergen Reactions   Penicillins Hives    Has patient had a PCN reaction causing immediate rash, facial/tongue/throat swelling, SOB or lightheadedness with hypotension: Yes Has patient had a PCN reaction causing severe rash involving mucus membranes or skin necrosis: No Has patient had a PCN reaction that required hospitalization: No Has patient had a PCN reaction occurring within the last 10 years: No If all of the above answers are "NO", then may proceed with Cephalosporin use.     CBC Latest Ref Rng & Units 06/23/2018  WBC 3.8 - 10.6 K/uL 7.3  Hemoglobin 13.0 - 18.0 g/dL 14.6  Hematocrit 40.0 - 52.0 % 43.1  Platelets 150 - 440 K/uL 214      CMP     Component Value Date/Time   NA 142 06/23/2018 0723   K 4.1 06/23/2018 0723   CL 107 06/23/2018 0723   CO2 23 06/23/2018 0723   GLUCOSE 126 (H) 06/23/2018 0723   BUN 19 06/23/2018 0723   CREATININE 0.81 06/23/2018 0723   CALCIUM 8.9 06/23/2018 0723   GFRNONAA >60 06/23/2018 0723   GFRAA >60 06/23/2018 0723     No results found.     Assessment & Plan:   1. Burning sensation of feet Today the patient's noninvasive studies are normal with no evidence decreased perfusion.  Based on patient's description of pain and timing I suspect it could be neuropathy and/or restless leg syndrome.  The patient notes that he has a family member with restless leg syndrome and takes Requip.  We will refer back to Dr. Ola Spurr for evaluation.  2. Bilateral carotid artery stenosis The patient has a history of of right carotid endarterectomy with a 40 to 59% left ICA stenosis.  This has not been rechecked in 3 years.  We will have the patient return in approximately 6 months to reevaluate his carotid status.  3. Essential (primary) hypertension Continue antihypertensive medications as already ordered, these medications  have been reviewed and there are no changes at this time.    Current Outpatient Medications on File Prior to Visit  Medication Sig Dispense Refill   aspirin EC 81 MG tablet Take 81 mg by mouth daily.     atorvastatin (LIPITOR) 40 MG tablet Take 40 mg by mouth daily.     diclofenac Sodium (VOLTAREN) 1 %  GEL Apply topically 4 (four) times daily.     hydrochlorothiazide (HYDRODIURIL) 25 MG tablet Take 25 mg by mouth daily.     MULTIPLE MINERALS PO Take by mouth.     atenolol (TENORMIN) 25 MG tablet Take by mouth.     fluticasone (FLONASE) 50 MCG/ACT nasal spray Place into both nostrils daily. (Patient not taking: Reported on 01/13/2022)     losartan (COZAAR) 100 MG tablet Take 100 mg by mouth daily. (Patient not taking: Reported on 01/13/2022)     vitamin B-12 (CYANOCOBALAMIN) 1000 MCG tablet Take 1,000 mcg by mouth daily. (Patient not taking: Reported on 01/13/2022)     No current facility-administered medications on file prior to visit.    There are no Patient Instructions on file for this visit. No follow-ups on file.   Kris Hartmann, NP

## 2022-07-09 ENCOUNTER — Other Ambulatory Visit (INDEPENDENT_AMBULATORY_CARE_PROVIDER_SITE_OTHER): Payer: Self-pay | Admitting: Nurse Practitioner

## 2022-07-09 DIAGNOSIS — I6523 Occlusion and stenosis of bilateral carotid arteries: Secondary | ICD-10-CM

## 2022-07-13 ENCOUNTER — Ambulatory Visit (INDEPENDENT_AMBULATORY_CARE_PROVIDER_SITE_OTHER): Payer: Medicare HMO | Admitting: Nurse Practitioner

## 2022-07-13 ENCOUNTER — Encounter (INDEPENDENT_AMBULATORY_CARE_PROVIDER_SITE_OTHER): Payer: Medicare HMO

## 2022-09-01 ENCOUNTER — Ambulatory Visit (INDEPENDENT_AMBULATORY_CARE_PROVIDER_SITE_OTHER): Payer: Medicare HMO | Admitting: Nurse Practitioner

## 2022-09-01 ENCOUNTER — Encounter (INDEPENDENT_AMBULATORY_CARE_PROVIDER_SITE_OTHER): Payer: Self-pay | Admitting: Nurse Practitioner

## 2022-09-01 ENCOUNTER — Ambulatory Visit (INDEPENDENT_AMBULATORY_CARE_PROVIDER_SITE_OTHER): Payer: Medicare HMO

## 2022-09-01 VITALS — BP 150/75 | HR 53 | Resp 17 | Ht 70.0 in | Wt 176.2 lb

## 2022-09-01 DIAGNOSIS — I6523 Occlusion and stenosis of bilateral carotid arteries: Secondary | ICD-10-CM

## 2022-09-01 DIAGNOSIS — I10 Essential (primary) hypertension: Secondary | ICD-10-CM

## 2022-09-13 ENCOUNTER — Encounter (INDEPENDENT_AMBULATORY_CARE_PROVIDER_SITE_OTHER): Payer: Self-pay | Admitting: Nurse Practitioner

## 2022-09-13 NOTE — Progress Notes (Signed)
Subjective:    Patient ID: Seth Rivera, male    DOB: 1946-01-22, 76 y.o.   MRN: [id number] No chief complaint on file.   The patient is seen for follow up evaluation of carotid stenosis. The carotid stenosis followed by ultrasound.   The patient denies amaurosis fugax. There is no recent history of TIA symptoms or focal motor deficits. There is no prior documented CVA.  The patient is taking enteric-coated aspirin 81 mg daily.  There is no history of migraine headaches. There is no history of seizures.  No recent shortening of the patient's walking distance or new symptoms consistent with claudication.  No history of rest pain symptoms. No new ulcers or wounds of the lower extremities have occurred.  There is no history of DVT, PE or superficial thrombophlebitis. No recent episodes of angina or shortness of breath documented.   Carotid Duplex done today shows 40-59%.  No change compared to last study done in 2019    Review of Systems  All other systems reviewed and are negative.      Objective:   Physical Exam Vitals reviewed.  HENT:     Head: Normocephalic.  Cardiovascular:     Rate and Rhythm: Normal rate.     Pulses: Normal pulses.  Pulmonary:     Effort: Pulmonary effort is normal.  Skin:    General: Skin is warm and dry.  Neurological:     Mental Status: He is alert and oriented to person, place, and time.  Psychiatric:        Mood and Affect: Mood normal.        Behavior: Behavior normal.        Thought Content: Thought content normal.        Judgment: Judgment normal.     BP (!) 150/75 (BP Location: Right Arm)   Pulse (!) 53   Resp 17   Ht '5\' 10"'$  (1.778 m)   Wt 176 lb 3.2 oz (79.9 kg)   BMI 25.28 kg/m   Past Medical History:  Diagnosis Date   Allergic rhinitis    Bladder-neck obstruction    Carotid stenosis    Colon polyp    COPD (chronic obstructive pulmonary disease) (HCC)    Coronary artery disease    Diabetes mellitus without  complication (HCC)    NO medications (patient state borderline diabetes)   Gastric ulcer    Hyperlipidemia    Hypertension    Myocardial infarction [facility])    Personal history of tobacco use, presenting hazards to health 05/20/2016   Pulmonary granuloma (HCC)    Pulmonary granuloma (HCC)    Restless leg syndrome    Restless legs syndrome (RLS)    Squamous cell carcinoma in situ    Tubular adenoma of colon    Tubular adenoma of colon     Social History   Socioeconomic History   Marital status: Married    Spouse name: Not on file   Number of children: Not on file   Years of education: Not on file   Highest education level: Not on file  Occupational History   Not on file  Tobacco Use   Smoking status: Some Days    Types: Cigarettes   Smokeless tobacco: Never  Substance and Sexual Activity   Alcohol use: No   Drug use: No   Sexual activity: Not on file  Other Topics Concern   Not on file  Social History Narrative   Not on file   Social  Determinants of Health   Financial Resource Strain: Not on file  Food Insecurity: Not on file  Transportation Needs: Not on file  Physical Activity: Not on file  Stress: Not on file  Social Connections: Not on file  Intimate Partner Violence: Not on file    Past Surgical History:  Procedure Laterality Date   CAROTID ENDARTERECTOMY     COLONOSCOPY WITH PROPOFOL N/A 09/06/2016   Procedure: COLONOSCOPY WITH PROPOFOL;  Surgeon: Manya Silvas, MD;  Location: [facility] ENDOSCOPY;  Service: Endoscopy;  Laterality: N/A;   COLONOSCOPY WITH PROPOFOL N/A 02/28/2020   Procedure: COLONOSCOPY WITH PROPOFOL;  Surgeon: Toledo, Benay Pike, MD;  Location: [facility] ENDOSCOPY;  Service: Gastroenterology;  Laterality: N/A;   CORONARY ANGIOPLASTY     CORONARY ANGIOPLASTY      Family History  Problem Relation Age of Onset   Lymphoma Mother    Lung cancer Father    Heart attack Brother    Diabetes Brother     Allergies  Allergen Reactions   Penicillins  Hives    Has patient had a PCN reaction causing immediate rash, facial/tongue/throat swelling, SOB or lightheadedness with hypotension: Yes Has patient had a PCN reaction causing severe rash involving mucus membranes or skin necrosis: No Has patient had a PCN reaction that required hospitalization: No Has patient had a PCN reaction occurring within the last 10 years: No If all of the above answers are "NO", then may proceed with Cephalosporin use.        Latest Ref Rng & Units 06/23/2018    7:23 AM  CBC  WBC 3.8 - 10.6 K/uL 7.3   Hemoglobin 13.0 - 18.0 g/dL 14.6   Hematocrit 40.0 - 52.0 % 43.1   Platelets 150 - 440 K/uL 214       CMP     Component Value Date/Time   NA 142 06/23/2018 0723   K 4.1 06/23/2018 0723   CL 107 06/23/2018 0723   CO2 23 06/23/2018 0723   GLUCOSE 126 (H) 06/23/2018 0723   BUN 19 06/23/2018 0723   CREATININE 0.81 06/23/2018 0723   CALCIUM 8.9 06/23/2018 0723   GFRNONAA >60 06/23/2018 0723   GFRAA >60 06/23/2018 0723     No results found.     Assessment & Plan:   1. Bilateral carotid artery stenosis Recommend:  Given the patient's asymptomatic subcritical stenosis no further invasive testing or surgery at this time.  Duplex ultrasound shows 40-59% stenosis bilaterally.  Continue antiplatelet therapy as prescribed Continue management of CAD, HTN and Hyperlipidemia Healthy heart diet,  encouraged exercise at least 4 times per week Follow up in 12 months with duplex ultrasound and physical exam    2. Essential (primary) hypertension Continue antihypertensive medications as already ordered, these medications have been reviewed and there are no changes at this time.    Current Outpatient Medications on File Prior to Visit  Medication Sig Dispense Refill   aspirin EC 81 MG tablet Take 81 mg by mouth daily.     atorvastatin (LIPITOR) 40 MG tablet Take 20 mg by mouth daily.     diclofenac Sodium (VOLTAREN) 1 % GEL Apply topically 4 (four)  times daily.     losartan (COZAAR) 100 MG tablet Take 100 mg by mouth daily.     MULTIPLE MINERALS PO Take by mouth.     No current facility-administered medications on file prior to visit.    There are no Patient Instructions on file for this visit. No follow-ups on file.  Kris Hartmann, NP

## 2022-10-01 DIAGNOSIS — I6523 Occlusion and stenosis of bilateral carotid arteries: Secondary | ICD-10-CM | POA: Diagnosis not present

## 2022-10-01 DIAGNOSIS — R972 Elevated prostate specific antigen [PSA]: Secondary | ICD-10-CM | POA: Diagnosis not present

## 2022-10-01 DIAGNOSIS — J439 Emphysema, unspecified: Secondary | ICD-10-CM | POA: Diagnosis not present

## 2022-10-01 DIAGNOSIS — G2581 Restless legs syndrome: Secondary | ICD-10-CM | POA: Diagnosis not present

## 2022-10-01 DIAGNOSIS — I2510 Atherosclerotic heart disease of native coronary artery without angina pectoris: Secondary | ICD-10-CM | POA: Diagnosis not present

## 2022-10-01 DIAGNOSIS — E785 Hyperlipidemia, unspecified: Secondary | ICD-10-CM | POA: Diagnosis not present

## 2022-10-01 DIAGNOSIS — I10 Essential (primary) hypertension: Secondary | ICD-10-CM | POA: Diagnosis not present

## 2022-10-01 DIAGNOSIS — E119 Type 2 diabetes mellitus without complications: Secondary | ICD-10-CM | POA: Diagnosis not present

## 2022-10-08 DIAGNOSIS — R972 Elevated prostate specific antigen [PSA]: Secondary | ICD-10-CM | POA: Diagnosis not present

## 2022-10-08 DIAGNOSIS — E119 Type 2 diabetes mellitus without complications: Secondary | ICD-10-CM | POA: Diagnosis not present

## 2022-10-08 DIAGNOSIS — I10 Essential (primary) hypertension: Secondary | ICD-10-CM | POA: Diagnosis not present

## 2022-10-08 DIAGNOSIS — Z23 Encounter for immunization: Secondary | ICD-10-CM | POA: Diagnosis not present

## 2022-10-08 DIAGNOSIS — E785 Hyperlipidemia, unspecified: Secondary | ICD-10-CM | POA: Diagnosis not present

## 2022-10-08 DIAGNOSIS — I6523 Occlusion and stenosis of bilateral carotid arteries: Secondary | ICD-10-CM | POA: Diagnosis not present

## 2022-10-18 ENCOUNTER — Encounter (INDEPENDENT_AMBULATORY_CARE_PROVIDER_SITE_OTHER): Payer: Self-pay

## 2022-11-08 DIAGNOSIS — Z03818 Encounter for observation for suspected exposure to other biological agents ruled out: Secondary | ICD-10-CM | POA: Diagnosis not present

## 2022-12-21 DIAGNOSIS — M9904 Segmental and somatic dysfunction of sacral region: Secondary | ICD-10-CM | POA: Diagnosis not present

## 2022-12-21 DIAGNOSIS — M62830 Muscle spasm of back: Secondary | ICD-10-CM | POA: Diagnosis not present

## 2022-12-21 DIAGNOSIS — M9903 Segmental and somatic dysfunction of lumbar region: Secondary | ICD-10-CM | POA: Diagnosis not present

## 2022-12-21 DIAGNOSIS — M5416 Radiculopathy, lumbar region: Secondary | ICD-10-CM | POA: Diagnosis not present

## 2022-12-22 DIAGNOSIS — M5416 Radiculopathy, lumbar region: Secondary | ICD-10-CM | POA: Diagnosis not present

## 2022-12-22 DIAGNOSIS — M62830 Muscle spasm of back: Secondary | ICD-10-CM | POA: Diagnosis not present

## 2022-12-22 DIAGNOSIS — M9903 Segmental and somatic dysfunction of lumbar region: Secondary | ICD-10-CM | POA: Diagnosis not present

## 2022-12-22 DIAGNOSIS — M9904 Segmental and somatic dysfunction of sacral region: Secondary | ICD-10-CM | POA: Diagnosis not present

## 2022-12-23 DIAGNOSIS — M9903 Segmental and somatic dysfunction of lumbar region: Secondary | ICD-10-CM | POA: Diagnosis not present

## 2022-12-23 DIAGNOSIS — M5416 Radiculopathy, lumbar region: Secondary | ICD-10-CM | POA: Diagnosis not present

## 2022-12-23 DIAGNOSIS — M9904 Segmental and somatic dysfunction of sacral region: Secondary | ICD-10-CM | POA: Diagnosis not present

## 2022-12-23 DIAGNOSIS — M62830 Muscle spasm of back: Secondary | ICD-10-CM | POA: Diagnosis not present

## 2022-12-27 DIAGNOSIS — M5416 Radiculopathy, lumbar region: Secondary | ICD-10-CM | POA: Diagnosis not present

## 2022-12-27 DIAGNOSIS — M62830 Muscle spasm of back: Secondary | ICD-10-CM | POA: Diagnosis not present

## 2022-12-27 DIAGNOSIS — M9904 Segmental and somatic dysfunction of sacral region: Secondary | ICD-10-CM | POA: Diagnosis not present

## 2022-12-27 DIAGNOSIS — M9903 Segmental and somatic dysfunction of lumbar region: Secondary | ICD-10-CM | POA: Diagnosis not present

## 2022-12-29 DIAGNOSIS — M5416 Radiculopathy, lumbar region: Secondary | ICD-10-CM | POA: Diagnosis not present

## 2022-12-29 DIAGNOSIS — M9903 Segmental and somatic dysfunction of lumbar region: Secondary | ICD-10-CM | POA: Diagnosis not present

## 2022-12-29 DIAGNOSIS — M9904 Segmental and somatic dysfunction of sacral region: Secondary | ICD-10-CM | POA: Diagnosis not present

## 2022-12-29 DIAGNOSIS — M62830 Muscle spasm of back: Secondary | ICD-10-CM | POA: Diagnosis not present

## 2022-12-31 DIAGNOSIS — M9904 Segmental and somatic dysfunction of sacral region: Secondary | ICD-10-CM | POA: Diagnosis not present

## 2022-12-31 DIAGNOSIS — M62830 Muscle spasm of back: Secondary | ICD-10-CM | POA: Diagnosis not present

## 2022-12-31 DIAGNOSIS — M9903 Segmental and somatic dysfunction of lumbar region: Secondary | ICD-10-CM | POA: Diagnosis not present

## 2022-12-31 DIAGNOSIS — M5416 Radiculopathy, lumbar region: Secondary | ICD-10-CM | POA: Diagnosis not present

## 2023-01-03 DIAGNOSIS — M62830 Muscle spasm of back: Secondary | ICD-10-CM | POA: Diagnosis not present

## 2023-01-03 DIAGNOSIS — M9904 Segmental and somatic dysfunction of sacral region: Secondary | ICD-10-CM | POA: Diagnosis not present

## 2023-01-03 DIAGNOSIS — M9903 Segmental and somatic dysfunction of lumbar region: Secondary | ICD-10-CM | POA: Diagnosis not present

## 2023-01-03 DIAGNOSIS — M5416 Radiculopathy, lumbar region: Secondary | ICD-10-CM | POA: Diagnosis not present

## 2023-01-04 DIAGNOSIS — J329 Chronic sinusitis, unspecified: Secondary | ICD-10-CM | POA: Diagnosis not present

## 2023-01-04 DIAGNOSIS — M5430 Sciatica, unspecified side: Secondary | ICD-10-CM | POA: Diagnosis not present

## 2023-01-05 DIAGNOSIS — M9903 Segmental and somatic dysfunction of lumbar region: Secondary | ICD-10-CM | POA: Diagnosis not present

## 2023-01-05 DIAGNOSIS — M9904 Segmental and somatic dysfunction of sacral region: Secondary | ICD-10-CM | POA: Diagnosis not present

## 2023-01-05 DIAGNOSIS — M62830 Muscle spasm of back: Secondary | ICD-10-CM | POA: Diagnosis not present

## 2023-01-05 DIAGNOSIS — M5416 Radiculopathy, lumbar region: Secondary | ICD-10-CM | POA: Diagnosis not present

## 2023-01-07 DIAGNOSIS — M62830 Muscle spasm of back: Secondary | ICD-10-CM | POA: Diagnosis not present

## 2023-01-07 DIAGNOSIS — M9903 Segmental and somatic dysfunction of lumbar region: Secondary | ICD-10-CM | POA: Diagnosis not present

## 2023-01-07 DIAGNOSIS — M9904 Segmental and somatic dysfunction of sacral region: Secondary | ICD-10-CM | POA: Diagnosis not present

## 2023-01-07 DIAGNOSIS — M5416 Radiculopathy, lumbar region: Secondary | ICD-10-CM | POA: Diagnosis not present

## 2023-01-10 DIAGNOSIS — M5416 Radiculopathy, lumbar region: Secondary | ICD-10-CM | POA: Diagnosis not present

## 2023-01-10 DIAGNOSIS — M9904 Segmental and somatic dysfunction of sacral region: Secondary | ICD-10-CM | POA: Diagnosis not present

## 2023-01-10 DIAGNOSIS — M9903 Segmental and somatic dysfunction of lumbar region: Secondary | ICD-10-CM | POA: Diagnosis not present

## 2023-01-10 DIAGNOSIS — M62830 Muscle spasm of back: Secondary | ICD-10-CM | POA: Diagnosis not present

## 2023-01-13 ENCOUNTER — Other Ambulatory Visit: Payer: Self-pay | Admitting: Family Medicine

## 2023-01-13 DIAGNOSIS — M8588 Other specified disorders of bone density and structure, other site: Secondary | ICD-10-CM | POA: Diagnosis not present

## 2023-01-13 DIAGNOSIS — M47816 Spondylosis without myelopathy or radiculopathy, lumbar region: Secondary | ICD-10-CM | POA: Diagnosis not present

## 2023-01-13 DIAGNOSIS — M545 Low back pain, unspecified: Secondary | ICD-10-CM | POA: Diagnosis not present

## 2023-01-13 DIAGNOSIS — M5416 Radiculopathy, lumbar region: Secondary | ICD-10-CM | POA: Diagnosis not present

## 2023-01-13 DIAGNOSIS — M5136 Other intervertebral disc degeneration, lumbar region: Secondary | ICD-10-CM | POA: Diagnosis not present

## 2023-01-13 DIAGNOSIS — M4316 Spondylolisthesis, lumbar region: Secondary | ICD-10-CM | POA: Diagnosis not present

## 2023-01-31 ENCOUNTER — Inpatient Hospital Stay: Admission: RE | Admit: 2023-01-31 | Payer: Medicare HMO | Source: Ambulatory Visit

## 2023-03-23 DIAGNOSIS — R972 Elevated prostate specific antigen [PSA]: Secondary | ICD-10-CM | POA: Diagnosis not present

## 2023-03-23 DIAGNOSIS — Z125 Encounter for screening for malignant neoplasm of prostate: Secondary | ICD-10-CM | POA: Diagnosis not present

## 2023-03-23 DIAGNOSIS — E119 Type 2 diabetes mellitus without complications: Secondary | ICD-10-CM | POA: Diagnosis not present

## 2023-03-23 DIAGNOSIS — E785 Hyperlipidemia, unspecified: Secondary | ICD-10-CM | POA: Diagnosis not present

## 2023-03-23 DIAGNOSIS — I6523 Occlusion and stenosis of bilateral carotid arteries: Secondary | ICD-10-CM | POA: Diagnosis not present

## 2023-03-23 DIAGNOSIS — G2581 Restless legs syndrome: Secondary | ICD-10-CM | POA: Diagnosis not present

## 2023-03-23 DIAGNOSIS — I10 Essential (primary) hypertension: Secondary | ICD-10-CM | POA: Diagnosis not present

## 2023-03-23 DIAGNOSIS — R202 Paresthesia of skin: Secondary | ICD-10-CM | POA: Diagnosis not present

## 2023-03-30 ENCOUNTER — Other Ambulatory Visit: Payer: Self-pay | Admitting: Infectious Diseases

## 2023-03-30 DIAGNOSIS — R972 Elevated prostate specific antigen [PSA]: Secondary | ICD-10-CM

## 2023-03-30 DIAGNOSIS — E785 Hyperlipidemia, unspecified: Secondary | ICD-10-CM | POA: Diagnosis not present

## 2023-03-30 DIAGNOSIS — Z1331 Encounter for screening for depression: Secondary | ICD-10-CM | POA: Diagnosis not present

## 2023-03-30 DIAGNOSIS — D649 Anemia, unspecified: Secondary | ICD-10-CM | POA: Diagnosis not present

## 2023-03-30 DIAGNOSIS — Z0000 Encounter for general adult medical examination without abnormal findings: Secondary | ICD-10-CM | POA: Diagnosis not present

## 2023-03-30 DIAGNOSIS — Z125 Encounter for screening for malignant neoplasm of prostate: Secondary | ICD-10-CM | POA: Diagnosis not present

## 2023-03-30 DIAGNOSIS — I10 Essential (primary) hypertension: Secondary | ICD-10-CM | POA: Diagnosis not present

## 2023-03-30 DIAGNOSIS — E119 Type 2 diabetes mellitus without complications: Secondary | ICD-10-CM | POA: Diagnosis not present

## 2023-03-31 ENCOUNTER — Ambulatory Visit
Admission: RE | Admit: 2023-03-31 | Discharge: 2023-03-31 | Disposition: A | Discharge status: Home / Self Care | Payer: Medicare HMO | Source: Ambulatory Visit | Attending: Infectious Diseases | Admitting: Infectious Diseases

## 2023-03-31 DIAGNOSIS — R972 Elevated prostate specific antigen [PSA]: Secondary | ICD-10-CM | POA: Insufficient documentation

## 2023-03-31 MED ORDER — GADOBUTROL 1 MMOL/ML IV SOLN
7.0000 mL | Freq: Once | INTRAVENOUS | Status: AC | PRN
  Administered 2023-03-31: 7 mL via INTRAVENOUS

## 2023-04-22 ENCOUNTER — Ambulatory Visit: Payer: Medicare HMO | Admitting: Urology

## 2023-04-22 ENCOUNTER — Encounter: Payer: Self-pay | Admitting: Urology

## 2023-04-22 VITALS — BP 161/76 | HR 66 | Ht 70.0 in | Wt 165.0 lb

## 2023-04-22 DIAGNOSIS — R972 Elevated prostate specific antigen [PSA]: Secondary | ICD-10-CM

## 2023-04-22 DIAGNOSIS — N429 Disorder of prostate, unspecified: Secondary | ICD-10-CM | POA: Diagnosis not present

## 2023-04-22 DIAGNOSIS — R935 Abnormal findings on diagnostic imaging of other abdominal regions, including retroperitoneum: Secondary | ICD-10-CM

## 2023-04-22 LAB — MICROSCOPIC EXAMINATION

## 2023-04-22 LAB — URINALYSIS, COMPLETE
Bilirubin, UA: NEGATIVE
Glucose, UA: NEGATIVE
Ketones, UA: NEGATIVE
Leukocytes,UA: NEGATIVE
Nitrite, UA: NEGATIVE
RBC, UA: NEGATIVE
Specific Gravity, UA: 1.02 (ref 1.005–1.030)
Urobilinogen, Ur: 0.2 mg/dL (ref 0.2–1.0)
pH, UA: 5.5 (ref 5.0–7.5)

## 2023-04-22 NOTE — Patient Instructions (Signed)
Prostate Biopsy Instructions  Stop all aspirin or blood thinners (aspirin, plavix, coumadin, warfarin, motrin, ibuprofen, advil, aleve, naproxen, naprosyn) for 7 days prior to the procedure.  If you have any questions about stopping these medications, please contact your primary care physician or cardiologist.  Having a light meal prior to the procedure is recommended.  If you are diabetic or have low blood sugar please bring a small snack or glucose tablet.  A Fleets enema is needed to be purchased over the counter at a local pharmacy and used 2 hours before you scheduled appointment.  This can be purchased over the counter at any pharmacy.  Antibiotics will be administered in the clinic at the time of the procedure unless otherwise specified.    Please bring someone with you to the procedure to drive you home.  A follow up appointment has been scheduled for you to receive the results of the biopsy.  If you have any questions or concerns, please feel free to call the office at [phone] or send a Mychart message.    Thank you, Staff at [facility]  

## 2023-04-22 NOTE — Progress Notes (Signed)
I, Maysun L Gibbs,acting as a scribe for Riki Altes, MD.,have documented all relevant documentation on the behalf of Riki Altes, MD,as directed by  Riki Altes, MD while in the presence of Riki Altes, MD.   04/22/2023 12:22 PM   Seth Rivera [birth year] [id number]  Referring provider: Gracelyn Nurse, MD [address] [place],  [place] [address]  Chief Complaint  Patient presents with   Elevated PSA    HPI: Seth Rivera is a 77 y.o. male here for evaluation of an elevated PSA.  PSA has slowly risen over the last few years. It was 5.46 February 2022, 6.24 July 2021, 8.73 April 2023. The most recent value of 14.0 April 2024. On record review, the last PSA before 2022 was in 2016 and was 3.43 MRI was ordered by PCP and performed 03/31/23, which showed a 57 gram gland and a PI-RADS 5 lesion of the left anterior peripheral zone extending from the base through the apex. No evidence of extracapsular extension, but there was abutment on the capsule of the PI-RADS 5 lesion. There was no pelvic adenopathy or evidence of bony metastatic disease.   PMH: Past Medical History:  Diagnosis Date   Allergic rhinitis    Bladder-neck obstruction    Carotid stenosis    Colon polyp    COPD (chronic obstructive pulmonary disease) (HCC)    Coronary artery disease    Diabetes mellitus without complication (HCC)    NO medications (patient state borderline diabetes)   Gastric ulcer    Hyperlipidemia    Hypertension    Myocardial infarction [facility])    Personal history of tobacco use, presenting hazards to health 05/20/2016   Pulmonary granuloma (HCC)    Pulmonary granuloma (HCC)    Restless leg syndrome    Restless legs syndrome (RLS)    Squamous cell carcinoma in situ    Tubular adenoma of colon    Tubular adenoma of colon     Surgical History: Past Surgical History:  Procedure Laterality Date   CAROTID ENDARTERECTOMY     COLONOSCOPY WITH PROPOFOL N/A 09/06/2016    Procedure: COLONOSCOPY WITH PROPOFOL;  Surgeon: Scot Jun, MD;  Location: [facility] ENDOSCOPY;  Service: Endoscopy;  Laterality: N/A;   COLONOSCOPY WITH PROPOFOL N/A 02/28/2020   Procedure: COLONOSCOPY WITH PROPOFOL;  Surgeon: Toledo, Boykin Nearing, MD;  Location: [facility] ENDOSCOPY;  Service: Gastroenterology;  Laterality: N/A;   CORONARY ANGIOPLASTY     CORONARY ANGIOPLASTY      Home Medications:  Allergies as of 04/22/2023       Reactions   Penicillins Hives   Has patient had a PCN reaction causing immediate rash, facial/tongue/throat swelling, SOB or lightheadedness with hypotension: Yes Has patient had a PCN reaction causing severe rash involving mucus membranes or skin necrosis: No Has patient had a PCN reaction that required hospitalization: No Has patient had a PCN reaction occurring within the last 10 years: No If all of the above answers are "NO", then may proceed with Cephalosporin use.        Medication List        Accurate as of Apr 22, 2023 12:22 PM. If you have any questions, ask your nurse or doctor.          aspirin EC 81 MG tablet Take 81 mg by mouth daily.   atorvastatin 40 MG tablet Commonly known as: LIPITOR Take 20 mg by mouth daily.   diclofenac Sodium 1 % Gel Commonly  known as: VOLTAREN Apply topically 4 (four) times daily.   losartan 100 MG tablet Commonly known as: COZAAR Take 100 mg by mouth daily.   MULTIPLE MINERALS PO Take by mouth.        Allergies:  Allergies  Allergen Reactions   Penicillins Hives    Has patient had a PCN reaction causing immediate rash, facial/tongue/throat swelling, SOB or lightheadedness with hypotension: Yes Has patient had a PCN reaction causing severe rash involving mucus membranes or skin necrosis: No Has patient had a PCN reaction that required hospitalization: No Has patient had a PCN reaction occurring within the last 10 years: No If all of the above answers are "NO", then may proceed with Cephalosporin  use.     Family History: Family History  Problem Relation Age of Onset   Lymphoma Mother    Lung cancer Father    Heart attack Brother    Diabetes Brother     Social History:  reports that he has been smoking cigarettes. He has never used smokeless tobacco. He reports that he does not drink alcohol and does not use drugs.   Physical Exam: BP (!) 161/76   Pulse 66   Ht 5\' 10"  (1.778 m)   Wt 165 lb (74.8 kg)   BMI 23.68 kg/m   Constitutional:  Alert and oriented, No acute distress. HEENT: [place] AT Respiratory: Normal respiratory effort, no increased work of breathing. GU: Prostate 40 grams, asymmetrically enlarged left > right, the left side is abnormally firm. Skin: No rashes, bruises or suspicious lesions. Neurologic: Grossly intact, no focal deficits, moving all 4 extremities. Psychiatric: Normal mood and affect.   Pertinent Imaging: MRI was personally reviewed and interpreted  EXAM: MR PROSTATE WITHOUT AND WITH CONTRAST   TECHNIQUE: Multiplanar multisequence MRI images were obtained of the pelvis centered about the prostate. Pre and post contrast images were obtained.   CONTRAST:  7mL GADAVIST GADOBUTROL 1 MMOL/ML IV SOLN   COMPARISON:  None Available.   FINDINGS: Prostate:   Hazy low T2 signal in the peripheral zone is nonfocal, likely postinflammatory, and is considered PI-RADS category 2.   Region of interest # 1: PI-RADS category 5 lesion of the left anterior peripheral zone extending from the base through the apex with involvement of the left anterior transition zone in the mid gland, and involvement of the left posteromedial and posterolateral peripheral zone in the mid gland and apex, with indistinctly marginated reduced T2 signal (image 13 of series 6) corresponding to reduced ADC map activity and restricted diffusion (image 17 of series 8 and 9). The associated signal is ill-defined and measures about 5.01 cc in volume (3.0 by 1.2 by 2.5 cm). No  obvious extra prostatic spread although wide region of capsular abutment raises risk of occult transcapsular spread.   There is hazy and patchy reduced T2 signal in the right peripheral zone but without focal lesion or substantially restricted diffusion identified.   Volume: 3D volumetric analysis: Prostate volume 57.01 cc (5.4 by 4.2 by 5.0 cm).   Transcapsular spread: Not directly visualized although increased risk occult transcapsular spread associated with region of interest # 1 due to the degree of capsular abutment.   Seminal vesicle involvement: Absent   Neurovascular bundle involvement: Absent   Pelvic adenopathy: Absent   Bone metastasis: Absent   Other findings: Sigmoid colon diverticulosis. There is some wall thickening in the distal sigmoid colon with associated enhancement for example on images 19 through 23 of 14, this may well be  due to diverticulosis or mild diverticulitis, correlate with colon cancer screening history in determining whether further workup to exclude the unlikely possibility of sigmoid colon cancer is indicated.   Iliac artery atherosclerotic vascular calcification.   IMPRESSION: 1. PI-RADS category 5 lesion involving most of the left peripheral zone and a small part of the left anterior transition zone. Targeting data sent to [facility]. 2. Sigmoid colon diverticulosis. There is some wall thickening in the distal sigmoid colon with associated enhancement, this may well be due to diverticulosis or mild diverticulitis, correlate with colon cancer screening history in determining whether further workup to exclude the unlikely possibility of sigmoid colon cancer is indicated. 3. Iliac artery atherosclerotic vascular calcification.     Electronically Signed   By: Gaylyn Rong M.D.   On: 04/04/2023 11:55   Assessment & Plan:    1. Elevated PSA Abnormal DRE Prostate MRI with PI-RADS 5 lesion Discuss findings with Mr. Botz and that  MRI is suspicious for high-grade prostate cancer.  Recommend scheduling prostate biopsy.  Based on his DRE and extent of MRI findings, we will schedule standard office cognitive biopsy and not MR fusion. The procedure was discussed, including potential risks of bleeding and infection/sepsis.  I have reviewed the above documentation for accuracy and completeness, and I agree with the above.   Riki Altes, MD  [facility] [address], [address] [place], [place] [address] [phone]

## 2023-05-25 ENCOUNTER — Ambulatory Visit: Payer: Medicare HMO | Admitting: Urology

## 2023-05-25 VITALS — BP 119/71 | HR 69 | Ht 70.0 in | Wt 177.0 lb

## 2023-05-25 DIAGNOSIS — C61 Malignant neoplasm of prostate: Secondary | ICD-10-CM | POA: Diagnosis not present

## 2023-05-25 DIAGNOSIS — N429 Disorder of prostate, unspecified: Secondary | ICD-10-CM

## 2023-05-25 DIAGNOSIS — Z2989 Encounter for other specified prophylactic measures: Secondary | ICD-10-CM

## 2023-05-25 DIAGNOSIS — R972 Elevated prostate specific antigen [PSA]: Secondary | ICD-10-CM | POA: Diagnosis not present

## 2023-05-25 MED ORDER — GENTAMICIN SULFATE 40 MG/ML IJ SOLN
80.0000 mg | Freq: Once | INTRAMUSCULAR | Status: AC
Start: 2023-05-25 — End: 2023-05-25
  Administered 2023-05-25: 80 mg via INTRAMUSCULAR

## 2023-05-25 MED ORDER — LEVOFLOXACIN 500 MG PO TABS
500.0000 mg | ORAL_TABLET | Freq: Once | ORAL | Status: AC
Start: 2023-05-25 — End: 2023-05-25
  Administered 2023-05-25: 500 mg via ORAL

## 2023-05-26 NOTE — Progress Notes (Signed)
   Prostate Biopsy Procedure   Informed consent was obtained after discussing risks/benefits of the procedure.  A time out was performed to ensure correct patient identity.  Pre-Procedure: - Last PSA Level: April 2024-14.0 - Gentamicin given prophylactically - Levaquin 500 mg administered PO -Transrectal Ultrasound performed revealing a 61 gm prostate -Left PZ hypoechoic base-apex  Procedure: - Prostate block performed using 10 cc 1% lidocaine and biopsies taken from sextant areas, a total of 12 under ultrasound guidance.  Post-Procedure: - Patient tolerated the procedure well - He was counseled to seek immediate medical attention if experiences any severe pain, significant bleeding, or fevers - Return in 2 weeks to discuss biopsy results   Irineo Axon, MD

## 2023-06-08 ENCOUNTER — Encounter: Payer: Self-pay | Admitting: Urology

## 2023-06-08 ENCOUNTER — Ambulatory Visit (INDEPENDENT_AMBULATORY_CARE_PROVIDER_SITE_OTHER): Payer: Medicare HMO | Admitting: Urology

## 2023-06-08 VITALS — BP 162/76 | HR 69 | Ht 70.0 in | Wt 174.0 lb

## 2023-06-08 DIAGNOSIS — C61 Malignant neoplasm of prostate: Secondary | ICD-10-CM

## 2023-06-08 NOTE — Progress Notes (Signed)
I, Duke Salvia, acting as a Neurosurgeon for Seth Altes, MD., have documented all relevant documentation on the behalf of Seth Altes, MD, as directed by  Seth Altes, MD while in the presence of Seth Altes, MD.   06/08/2023 4:36 PM   Rodena Medin February 10, 1946 [id number]  Referring provider: Jaclyn Shaggy, MD [address]   [place],  [place] [address]  Chief Complaint  Patient presents with   Elevated PSA    HPI: SIRMICHAEL LORENSON is a 77 y.o. male presents for prostate biopsy follow up.  Prostate biopsy 05/25/23 PSA 14, TRUS 61 g prostate; left peripheral zone was hypochoic from base-apex. Standard 12 core biopsy were performed. Prior MRI showed category or PI-RADS5 lesion, left peripheral zone from base through apex. Felt to be increased risks for alcohol transcapsular spread at the PI-RADS site due to capsular abutment. No seminal vesicle involvement, pelvic adenopathy, or bony pelvic metastatic disease. Pathology: 5/6 left sided cores positive for gleason 4+4 adenocarcinoma involving 70-100% of the submitted tissue. Core from the right base showed Glason 4+5 adenocarcinoma involving 41% of the tissue.Low volume gleason 3+4/4+4. Cancer 1-2% right lateral base, right mid, and right apical region.   PMH: Past Medical History:  Diagnosis Date   Allergic rhinitis    Bladder-neck obstruction    Carotid stenosis    Colon polyp    COPD (chronic obstructive pulmonary disease) (HCC)    Coronary artery disease    Diabetes mellitus without complication (HCC)    NO medications (patient state borderline diabetes)   Gastric ulcer    Hyperlipidemia    Hypertension    Myocardial infarction [facility])    Personal history of tobacco use, presenting hazards to health 05/20/2016   Pulmonary granuloma (HCC)    Pulmonary granuloma (HCC)    Restless leg syndrome    Restless legs syndrome (RLS)    Squamous cell carcinoma in situ    Tubular adenoma of colon    Tubular adenoma of  colon     Surgical History: Past Surgical History:  Procedure Laterality Date   CAROTID ENDARTERECTOMY     COLONOSCOPY WITH PROPOFOL N/A 09/06/2016   Procedure: COLONOSCOPY WITH PROPOFOL;  Surgeon: Scot Jun, MD;  Location: [facility] ENDOSCOPY;  Service: Endoscopy;  Laterality: N/A;   COLONOSCOPY WITH PROPOFOL N/A 02/28/2020   Procedure: COLONOSCOPY WITH PROPOFOL;  Surgeon: Toledo, Boykin Nearing, MD;  Location: [facility] ENDOSCOPY;  Service: Gastroenterology;  Laterality: N/A;   CORONARY ANGIOPLASTY     CORONARY ANGIOPLASTY      Home Medications:  Allergies as of 06/08/2023       Reactions   Penicillins Hives   Has patient had a PCN reaction causing immediate rash, facial/tongue/throat swelling, SOB or lightheadedness with hypotension: Yes Has patient had a PCN reaction causing severe rash involving mucus membranes or skin necrosis: No Has patient had a PCN reaction that required hospitalization: No Has patient had a PCN reaction occurring within the last 10 years: No If all of the above answers are "NO", then may proceed with Cephalosporin use.        Medication List        Accurate as of June 08, 2023  4:36 PM. If you have any questions, ask your nurse or doctor.          aspirin EC 81 MG tablet Take 81 mg by mouth daily.   atorvastatin 40 MG tablet Commonly known as: LIPITOR Take 20 mg  by mouth daily.   diclofenac Sodium 1 % Gel Commonly known as: VOLTAREN Apply topically 4 (four) times daily.   losartan 100 MG tablet Commonly known as: COZAAR Take 100 mg by mouth daily.   MULTIPLE MINERALS PO Take by mouth.        Allergies:  Allergies  Allergen Reactions   Penicillins Hives    Has patient had a PCN reaction causing immediate rash, facial/tongue/throat swelling, SOB or lightheadedness with hypotension: Yes Has patient had a PCN reaction causing severe rash involving mucus membranes or skin necrosis: No Has patient had a PCN reaction that required  hospitalization: No Has patient had a PCN reaction occurring within the last 10 years: No If all of the above answers are "NO", then may proceed with Cephalosporin use.     Family History: Family History  Problem Relation Age of Onset   Lymphoma Mother    Lung cancer Father    Heart attack Brother    Diabetes Brother     Social History:  reports that he has been smoking cigarettes. He has never used smokeless tobacco. He reports that he does not drink alcohol and does not use drugs.   Physical Exam: BP (!) 162/76   Pulse 69   Ht 5\' 10"  (1.778 m)   Wt 174 lb (78.9 kg)   BMI 24.97 kg/m   Constitutional:  Alert and oriented, No acute distress. HEENT: [place] AT Respiratory: Normal respiratory effort, no increased work of breathing. Skin: No rashes, bruises or suspicious lesions. Neurologic: Grossly intact, no focal deficits, moving all 4 extremities. Psychiatric: Normal mood and affect.    Assessment & Plan:    1. Prostate cancer NCCN very high risk. Prostate MRI showed no definite extraprostatic disease. Bone scan scheduled.His life expectancy is greater than 5 years and we discussed the option of radiation modalities with ADT and the addition of Docetaxel or Abiraterone. Other options were discussed including ADT alone or observation which were nit recommended. He is interested in radiation oncology referral for further discussion of treatment.      [facility] [address] [place], [place] [address] [phone]

## 2023-06-16 ENCOUNTER — Encounter
Admission: RE | Admit: 2023-06-16 | Discharge: 2023-06-16 | Disposition: A | Discharge status: Home / Self Care | Payer: Medicare HMO | Source: Ambulatory Visit | Attending: Urology | Admitting: Urology

## 2023-06-16 DIAGNOSIS — C61 Malignant neoplasm of prostate: Secondary | ICD-10-CM | POA: Diagnosis not present

## 2023-06-16 DIAGNOSIS — M19011 Primary osteoarthritis, right shoulder: Secondary | ICD-10-CM | POA: Diagnosis not present

## 2023-06-16 DIAGNOSIS — M19012 Primary osteoarthritis, left shoulder: Secondary | ICD-10-CM | POA: Diagnosis not present

## 2023-06-16 DIAGNOSIS — N1330 Unspecified hydronephrosis: Secondary | ICD-10-CM | POA: Diagnosis not present

## 2023-06-16 MED ORDER — TECHNETIUM TC 99M MEDRONATE IV KIT
22.0000 | PACK | Freq: Once | INTRAVENOUS | Status: AC | PRN
  Administered 2023-06-16: 21.66 via INTRAVENOUS

## 2023-06-20 ENCOUNTER — Ambulatory Visit
Admission: RE | Admit: 2023-06-20 | Discharge: 2023-06-20 | Disposition: A | Discharge status: Home / Self Care | Payer: Medicare HMO | Source: Ambulatory Visit | Attending: Radiation Oncology | Admitting: Radiation Oncology

## 2023-06-20 ENCOUNTER — Telehealth: Payer: Self-pay

## 2023-06-20 ENCOUNTER — Encounter: Payer: Self-pay | Admitting: Radiation Oncology

## 2023-06-20 VITALS — BP 144/82 | HR 62 | Temp 97.7°F | Resp 16 | Ht 70.0 in | Wt 175.4 lb

## 2023-06-20 DIAGNOSIS — C61 Malignant neoplasm of prostate: Secondary | ICD-10-CM | POA: Diagnosis not present

## 2023-06-20 DIAGNOSIS — Z191 Hormone sensitive malignancy status: Secondary | ICD-10-CM | POA: Diagnosis not present

## 2023-06-20 NOTE — Telephone Encounter (Signed)
-----   Message from Dorcas Carrow, RN sent at 06/20/2023  9:20 AM EDT ----- Regarding: Markers and Eligard Good Morning,  This patient will need markers placed and an Eligard injection.    Thanks,  Ricki Rodriguez

## 2023-06-20 NOTE — Consult Note (Signed)
NEW PATIENT EVALUATION  Name: Seth Rivera  MRN: [id number]  Date:   06/20/2023     DOB: Nov 29, 1946   This 77 y.o. 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  REFERRING PHYSICIAN: Jaclyn Shaggy, MD  CHIEF COMPLAINT:  Chief Complaint  Patient presents with   Prostate Cancer    consult    DIAGNOSIS: The encounter diagnosis was Malignant neoplasm of prostate (HCC).   PREVIOUS INVESTIGATIONS:  MRI scan bone scan reviewed Clinical notes reviewed Pathology report reviewed  HPI: Patient is a 77 year old male who presented back in June with a PSA of 14.  This prompted an MRI scan showing a PI-RADS category 5 lesion in the most left peripheral zone and small part of the left anterior transitional zone.  There is no evidence of trans capsular spread no evidence of pelvic lymphadenopathy or bone metastasis.  Patient underwent biopsy showing 5-6 left-sided core biopsies positive for Gleason 8 (4+4).  He also had Gleason 9 (4+5.  There was approximately 8 out of 12 cores examined.  Patient had a bone scan although not officially read and my interpretation is no evidence of metastatic disease.  Patient is fairly asymptomatic specifically denies increased lower urinary tract symptoms does have a history of a slight intermittent diarrhea.  No bone pain.  His prostate volume was 61 cc.  PLANNED TREATMENT REGIMEN: Image guided IMRT radiation therapy to prostate and pelvic nodes plus ADT therapy  PAST MEDICAL HISTORY:  has a past medical history of Allergic rhinitis, Bladder-neck obstruction, Carotid stenosis, Colon polyp, COPD (chronic obstructive pulmonary disease) (HCC), Coronary artery disease, Diabetes mellitus without complication (HCC), Gastric ulcer, Hyperlipidemia, Hypertension, Myocardial infarction [facility]), Personal history of tobacco use, presenting hazards to health (05/20/2016), Pulmonary  granuloma (HCC), Pulmonary granuloma (HCC), Restless leg syndrome, Restless legs syndrome (RLS), Squamous cell carcinoma in situ, Tubular adenoma of colon, and Tubular adenoma of colon.    PAST SURGICAL HISTORY:  Past Surgical History:  Procedure Laterality Date   CAROTID ENDARTERECTOMY     COLONOSCOPY WITH PROPOFOL N/A 09/06/2016   Procedure: COLONOSCOPY WITH PROPOFOL;  Surgeon: Scot Jun, MD;  Location: [facility] ENDOSCOPY;  Service: Endoscopy;  Laterality: N/A;   COLONOSCOPY WITH PROPOFOL N/A 02/28/2020   Procedure: COLONOSCOPY WITH PROPOFOL;  Surgeon: Toledo, Boykin Nearing, MD;  Location: [facility] ENDOSCOPY;  Service: Gastroenterology;  Laterality: N/A;   CORONARY ANGIOPLASTY     CORONARY ANGIOPLASTY      FAMILY HISTORY: family history includes Diabetes in his brother; Heart attack in his brother; Lung cancer in his father; Lymphoma in his mother.  SOCIAL HISTORY:  reports that he has been smoking cigarettes. He has never used smokeless tobacco. He reports that he does not drink alcohol and does not use drugs.  ALLERGIES: Penicillins  MEDICATIONS:  Current Outpatient Medications  Medication Sig Dispense Refill   aspirin EC 81 MG tablet Take 81 mg by mouth daily.     atorvastatin (LIPITOR) 40 MG tablet Take 20 mg by mouth daily.     diclofenac Sodium (VOLTAREN) 1 % GEL Apply topically 4 (four) times daily.     losartan (COZAAR) 100 MG tablet Take 100 mg by mouth daily.     MULTIPLE MINERALS PO Take by mouth.     No current facility-administered medications for this encounter.    ECOG PERFORMANCE STATUS:  0 - Asymptomatic  REVIEW OF SYSTEMS: Patient  denies any weight loss, fatigue, weakness, fever, chills or night sweats. Patient denies any loss of vision, blurred vision. Patient denies any ringing  of the ears or hearing loss. No irregular heartbeat. Patient denies heart murmur or history of fainting. Patient denies any chest pain or pain radiating to her upper extremities. Patient  denies any shortness of breath, difficulty breathing at night, cough or hemoptysis. Patient denies any swelling in the lower legs. Patient denies any nausea vomiting, vomiting of blood, or coffee ground material in the vomitus. Patient denies any stomach pain. Patient states has had normal bowel movements no significant constipation or diarrhea. Patient denies any dysuria, hematuria or significant nocturia. Patient denies any problems walking, swelling in the joints or loss of balance. Patient denies any skin changes, loss of hair or loss of weight. Patient denies any excessive worrying or anxiety or significant depression. Patient denies any problems with insomnia. Patient denies excessive thirst, polyuria, polydipsia. Patient denies any swollen glands, patient denies easy bruising or easy bleeding. Patient denies any recent infections, allergies or URI. Patient "s visual fields have not changed significantly in recent time.   PHYSICAL EXAM: BP (!) 144/82   Pulse 62   Temp 97.7 F (36.5 C)   Resp 16   Ht 5\' 10"  (1.778 m)   Wt 175 lb 6.4 oz (79.6 kg)   BMI 25.17 kg/m  Well-developed well-nourished patient in NAD. HEENT reveals PERLA, EOMI, discs not visualized.  Oral cavity is clear. No oral mucosal lesions are identified. Neck is clear without evidence of cervical or supraclavicular adenopathy. Lungs are clear to A&P. Cardiac examination is essentially unremarkable with regular rate and rhythm without murmur rub or thrill. Abdomen is benign with no organomegaly or masses noted. Motor sensory and DTR levels are equal and symmetric in the upper and lower extremities. Cranial nerves II through XII are grossly intact. Proprioception is intact. No peripheral adenopathy or edema is identified. No motor or sensory levels are noted. Crude visual fields are within normal range.  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  Based on his high Gleason score I have run the [facility] nomogram showing approximately 36% chance of lymph node involvement as well as 36% chance of seminal vesicle involvement.  I will plan on delivering 68 [address] Cullens to his prostate and 68 [address] Cullens to his pelvic nodes.  Risks and benefits of treatment including increased lower Neri tract symptoms diarrhea fatigue alteration of blood counts skin reaction all were discussed in detail.  Patient has a vacation scheduled for early October.  I have asked Dr. Lonna Cobb to place fiducial markers in his prostate for daily image guided treatment.  I also asked him to start him on Eligard therapy.  I have set him up for simulation after his [place] trip.  I am comforting the idea that he will be on Eligard as initiation of his treatment prior to that trip.  Patient wife both comprehend my treatment plan well.  I would like to take this opportunity to thank you for allowing me to participate in the care of your patient.Carmina Miller, MD

## 2023-06-20 NOTE — Telephone Encounter (Signed)
No PA required for Eligard.  

## 2023-06-24 ENCOUNTER — Other Ambulatory Visit: Payer: Self-pay

## 2023-06-24 DIAGNOSIS — N1339 Other hydronephrosis: Secondary | ICD-10-CM

## 2023-07-01 ENCOUNTER — Ambulatory Visit
Admission: RE | Admit: 2023-07-01 | Discharge: 2023-07-01 | Disposition: A | Discharge status: Home / Self Care | Payer: Medicare HMO | Source: Ambulatory Visit | Attending: Urology | Admitting: Urology

## 2023-07-01 DIAGNOSIS — N1339 Other hydronephrosis: Secondary | ICD-10-CM | POA: Insufficient documentation

## 2023-07-01 DIAGNOSIS — K5730 Diverticulosis of large intestine without perforation or abscess without bleeding: Secondary | ICD-10-CM | POA: Diagnosis not present

## 2023-07-01 DIAGNOSIS — C61 Malignant neoplasm of prostate: Secondary | ICD-10-CM | POA: Diagnosis not present

## 2023-07-01 LAB — POCT I-STAT CREATININE: Creatinine, Ser: 0.9 mg/dL (ref 0.61–1.24)

## 2023-07-01 MED ORDER — IOHEXOL 300 MG/ML  SOLN
100.0000 mL | Freq: Once | INTRAMUSCULAR | Status: AC | PRN
  Administered 2023-07-01: 80 mL via INTRAVENOUS

## 2023-07-12 DIAGNOSIS — Z191 Hormone sensitive malignancy status: Secondary | ICD-10-CM | POA: Diagnosis not present

## 2023-07-12 DIAGNOSIS — C61 Malignant neoplasm of prostate: Secondary | ICD-10-CM | POA: Diagnosis not present

## 2023-07-13 ENCOUNTER — Encounter: Payer: Self-pay | Admitting: Urology

## 2023-07-13 ENCOUNTER — Ambulatory Visit: Payer: Medicare HMO | Admitting: Urology

## 2023-07-13 VITALS — BP 128/75 | HR 66 | Ht 70.0 in | Wt 175.0 lb

## 2023-07-13 DIAGNOSIS — Z2989 Encounter for other specified prophylactic measures: Secondary | ICD-10-CM | POA: Diagnosis not present

## 2023-07-13 DIAGNOSIS — C61 Malignant neoplasm of prostate: Secondary | ICD-10-CM | POA: Diagnosis not present

## 2023-07-13 DIAGNOSIS — N1330 Unspecified hydronephrosis: Secondary | ICD-10-CM | POA: Diagnosis not present

## 2023-07-13 MED ORDER — LEVOFLOXACIN 500 MG PO TABS
500.0000 mg | ORAL_TABLET | Freq: Once | ORAL | Status: AC
Start: 2023-07-13 — End: 2023-07-13
  Administered 2023-07-13: 500 mg via ORAL

## 2023-07-13 MED ORDER — LEUPROLIDE ACETATE (6 MONTH) 45 MG ~~LOC~~ KIT
45.0000 mg | PACK | Freq: Once | SUBCUTANEOUS | Status: AC
Start: 2023-07-13 — End: 2023-07-13
  Administered 2023-07-13: 45 mg via SUBCUTANEOUS

## 2023-07-13 MED ORDER — GENTAMICIN SULFATE 40 MG/ML IJ SOLN
80.0000 mg | Freq: Once | INTRAMUSCULAR | Status: AC
Start: 2023-07-13 — End: 2023-07-13
  Administered 2023-07-13: 80 mg via INTRAMUSCULAR

## 2023-07-13 NOTE — Progress Notes (Unsigned)
Eligard SubQ Injection   Due to Prostate Cancer patient is present today for a Eligard Injection.  Medication: Eligard 6 month Dose: 45 mg  Location: left  Lot: [id number] Exp: 08/2024  Patient tolerated well, no complications were noted  Performed by: Ples Specter CMA   Per Dr. Lonna Cobb  patient is to continue therapy for 2-3 years . Patient's next follow up was scheduled for 6 months. This appointment was scheduled using wheel and given to patient today along with reminder continue on Vitamin D 800-1000iu and Calcium 1000-1200mg  daily while on Androgen Deprivation Therapy.  PA approval dates:

## 2023-07-15 ENCOUNTER — Other Ambulatory Visit: Payer: Self-pay

## 2023-07-15 ENCOUNTER — Other Ambulatory Visit: Payer: Self-pay | Admitting: Urology

## 2023-07-15 ENCOUNTER — Telehealth: Payer: Self-pay

## 2023-07-15 DIAGNOSIS — N1330 Unspecified hydronephrosis: Secondary | ICD-10-CM

## 2023-07-15 DIAGNOSIS — N2889 Other specified disorders of kidney and ureter: Secondary | ICD-10-CM

## 2023-07-15 NOTE — H&P (View-Only) (Signed)
07/13/2023 7:16 AM   Seth Rivera [birth year] [id number]  Referring provider: Jaclyn Shaggy, MD [address]   [place],  [place] [address]  Chief Complaint  Patient presents with   Seth Rivera Placement    HPI: 78 y.o. 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  On bone scan he was noted to have right hydronephrosis which was further evaluated on a CT scan performed 07/01/2023.  CT abdomen pelvis with contrast was remarkable for bilateral hydronephrosis, R >L; with enhancing proximal ureteral lesions bilaterally.  Denies flank, abdominal pain or gross hematuria.  Creatinine 07/01/2023 was 0.9.   PMH: Past Medical History:  Diagnosis Date   Allergic rhinitis    Bladder-neck obstruction    Carotid stenosis    Colon polyp    COPD (chronic obstructive pulmonary disease) (HCC)    Coronary artery disease    Diabetes mellitus without complication (HCC)    NO medications (patient state borderline diabetes)   Gastric ulcer    Hyperlipidemia    Hypertension    Myocardial infarction [facility])    Personal history of tobacco use, presenting hazards to health 05/20/2016   Pulmonary granuloma (HCC)    Pulmonary granuloma (HCC)    Restless leg syndrome    Restless legs syndrome (RLS)    Squamous cell carcinoma in situ    Tubular adenoma of colon    Tubular adenoma of colon     Surgical History: Past Surgical History:  Procedure Laterality Date   CAROTID ENDARTERECTOMY     COLONOSCOPY WITH PROPOFOL N/A 09/06/2016   Procedure: COLONOSCOPY WITH PROPOFOL;  Surgeon: Scot Jun, MD;  Location: [facility] ENDOSCOPY;  Service: Endoscopy;  Laterality: N/A;   COLONOSCOPY WITH PROPOFOL N/A 02/28/2020   Procedure: COLONOSCOPY WITH PROPOFOL;  Surgeon: Toledo, Boykin Nearing, MD;  Location: [facility] ENDOSCOPY;  Service: Gastroenterology;  Laterality: N/A;   CORONARY ANGIOPLASTY     CORONARY ANGIOPLASTY      Home Medications:  Allergies as of 07/13/2023       Reactions    Penicillins Hives   Has patient had a PCN reaction causing immediate rash, facial/tongue/throat swelling, SOB or lightheadedness with hypotension: Yes Has patient had a PCN reaction causing severe rash involving mucus membranes or skin necrosis: No Has patient had a PCN reaction that required hospitalization: No Has patient had a PCN reaction occurring within the last 10 years: No If all of the above answers are "NO", then may proceed with Cephalosporin use.        Medication List        Accurate as of July 13, 2023 11:59 PM. If you have any questions, ask your nurse or doctor.          aspirin EC 81 MG tablet Take 81 mg by mouth daily.   atorvastatin 40 MG tablet Commonly known as: LIPITOR Take 20 mg by mouth daily.   diclofenac Sodium 1 % Gel Commonly known as: VOLTAREN Apply topically 4 (four) times daily.   losartan 100 MG tablet Commonly known as: COZAAR Take 100 mg by mouth daily.   MULTIPLE MINERALS PO Take by mouth.        Allergies:  Allergies  Allergen Reactions   Penicillins Hives    Has patient had a PCN reaction causing immediate rash, facial/tongue/throat swelling, SOB or lightheadedness with hypotension: Yes Has patient had a PCN reaction causing severe rash involving mucus membranes or skin necrosis: No Has patient had a PCN  reaction that required hospitalization: No Has patient had a PCN reaction occurring within the last 10 years: No If all of the above answers are "NO", then may proceed with Cephalosporin use.     Family History: Family History  Problem Relation Age of Onset   Lymphoma Mother    Lung cancer Father    Heart attack Brother    Diabetes Brother     Social History:  reports that he has been smoking cigarettes. He has never used smokeless tobacco. He reports that he does not drink alcohol and does not use drugs.   Physical Exam: BP 128/75   Pulse 66   Ht 5\' 10"  (1.778 m)   Wt 175 lb (79.4 kg)   BMI 25.11 kg/m    Constitutional:  Alert,, No acute distress. HEENT: [place] AT Respiratory: Normal respiratory effort, no increased work of breathing. Psychiatric: Normal mood and affect.  Laboratory Data: Lab Results  Component Value Date   CREATININE 0.90 07/01/2023    Pertinent Imaging: CT images were personally reviewed and interpreted  Assessment & Plan:    1.  Bilateral hydronephrosis CT findings were discussed in detail with Seth Rivera and we discussed possibility of bilateral proximal ureteral masses Recommend scheduling cystoscopy with bilateral retrograde pyelograms and possible bilateral ureteroscopy with biopsy and stent placement The procedure was discussed in detail including potential risks of bleeding, infection, ureteral injury and lower urinary tract symptoms/bladder/flank pain secondary to indwelling stent All questions were answered and he desires to schedule   Seth Altes, MD  [facility] [address], [address] [place], [place] [address] [phone]

## 2023-07-15 NOTE — Telephone Encounter (Signed)
I spoke with Mr. Rigler. We have discussed possible surgery dates and Tuesday August 6th,2024 was agreed upon by all parties. Patient given information about surgery date, what to expect pre-operatively and post operatively.  We discussed that a Pre-Admission Testing office will be calling to set up the pre-op visit that will take place prior to surgery, and that these appointments are typically done over the phone with a Pre-Admissions RN. Informed patient that our office will communicate any additional care to be provided after surgery. Patients questions or concerns were discussed during our call. Advised to call our office should there be any additional information, questions or concerns that arise. Patient verbalized understanding.

## 2023-07-15 NOTE — Progress Notes (Unsigned)
07/13/2023 7:16 AM   Seth Rivera [birth year] [id number]  Referring provider: Jaclyn Shaggy, MD [address]   [place],  [place] [address]  Chief Complaint  Patient presents with   Seth Rivera Placement    HPI: 78 y.o. 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  On bone scan he was noted to have right hydronephrosis which was further evaluated on a CT scan performed 07/01/2023.  CT abdomen pelvis with contrast was remarkable for bilateral hydronephrosis, R >L; with enhancing proximal ureteral lesions bilaterally.  Denies flank, abdominal pain or gross hematuria.  Creatinine 07/01/2023 was 0.9.   PMH: Past Medical History:  Diagnosis Date   Allergic rhinitis    Bladder-neck obstruction    Carotid stenosis    Colon polyp    COPD (chronic obstructive pulmonary disease) (HCC)    Coronary artery disease    Diabetes mellitus without complication (HCC)    NO medications (patient state borderline diabetes)   Gastric ulcer    Hyperlipidemia    Hypertension    Myocardial infarction [facility])    Personal history of tobacco use, presenting hazards to health 05/20/2016   Pulmonary granuloma (HCC)    Pulmonary granuloma (HCC)    Restless leg syndrome    Restless legs syndrome (RLS)    Squamous cell carcinoma in situ    Tubular adenoma of colon    Tubular adenoma of colon     Surgical History: Past Surgical History:  Procedure Laterality Date   CAROTID ENDARTERECTOMY     COLONOSCOPY WITH PROPOFOL N/A 09/06/2016   Procedure: COLONOSCOPY WITH PROPOFOL;  Surgeon: Seth Jun, MD;  Location: [facility] ENDOSCOPY;  Service: Endoscopy;  Laterality: N/A;   COLONOSCOPY WITH PROPOFOL N/A 02/28/2020   Procedure: COLONOSCOPY WITH PROPOFOL;  Surgeon: Seth Rivera, Seth Nearing, MD;  Location: [facility] ENDOSCOPY;  Service: Gastroenterology;  Laterality: N/A;   CORONARY ANGIOPLASTY     CORONARY ANGIOPLASTY      Home Medications:  Allergies as of 07/13/2023       Reactions    Penicillins Hives   Has patient had a PCN reaction causing immediate rash, facial/tongue/throat swelling, SOB or lightheadedness with hypotension: Yes Has patient had a PCN reaction causing severe rash involving mucus membranes or skin necrosis: No Has patient had a PCN reaction that required hospitalization: No Has patient had a PCN reaction occurring within the last 10 years: No If all of the above answers are "NO", then may proceed with Cephalosporin use.        Medication List        Accurate as of July 13, 2023 11:59 PM. If you have any questions, ask your nurse or doctor.          aspirin EC 81 MG tablet Take 81 mg by mouth daily.   atorvastatin 40 MG tablet Commonly known as: LIPITOR Take 20 mg by mouth daily.   diclofenac Sodium 1 % Gel Commonly known as: VOLTAREN Apply topically 4 (four) times daily.   losartan 100 MG tablet Commonly known as: COZAAR Take 100 mg by mouth daily.   MULTIPLE MINERALS PO Take by mouth.        Allergies:  Allergies  Allergen Reactions   Penicillins Hives    Has patient had a PCN reaction causing immediate rash, facial/tongue/throat swelling, SOB or lightheadedness with hypotension: Yes Has patient had a PCN reaction causing severe rash involving mucus membranes or skin necrosis: No Has patient had a PCN  reaction that required hospitalization: No Has patient had a PCN reaction occurring within the last 10 years: No If all of the above answers are "NO", then may proceed with Cephalosporin use.     Family History: Family History  Problem Relation Age of Onset   Lymphoma Mother    Lung cancer Father    Heart attack Brother    Diabetes Brother     Social History:  reports that he has been smoking cigarettes. He has never used smokeless tobacco. He reports that he does not drink alcohol and does not use drugs.   Physical Exam: BP 128/75   Pulse 66   Ht 5\' 10"  (1.778 m)   Wt 175 lb (79.4 kg)   BMI 25.11 kg/m    Constitutional:  Alert,, No acute distress. HEENT: [place] AT Respiratory: Normal respiratory effort, no increased work of breathing. Psychiatric: Normal mood and affect.  Laboratory Data: Lab Results  Component Value Date   CREATININE 0.90 07/01/2023    Pertinent Imaging: CT images were personally reviewed and interpreted  Assessment & Plan:    1.  Bilateral hydronephrosis CT findings were discussed in detail with [name] and we discussed possibility of bilateral proximal ureteral masses Recommend scheduling cystoscopy with bilateral retrograde pyelograms and possible bilateral ureteroscopy with biopsy and stent placement The procedure was discussed in detail including potential risks of bleeding, infection, ureteral injury and lower urinary tract symptoms/bladder/flank pain secondary to indwelling stent All questions were answered and he desires to schedule   Seth Altes, MD  [facility] [address], [address] [place], [place] [address] [phone]

## 2023-07-15 NOTE — Progress Notes (Unsigned)
   07/15/23  CC: gold fiducial marker placement  HPI: 77 y.o. 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 Rushie Chestnut.  Prostate Gold fiducial Marker Placement Procedure   Informed consent was obtained after discussing risks/benefits of the procedure.  A time out was performed to ensure correct patient identity.  Pre-Procedure: - Gentamicin given prophylactically - PO Levaquin 500 mg also given today  Procedure: - Lidocaine jelly was administered per rectum - Rectal ultrasound probe was placed without difficulty and the prostate visualized - Prostatic block performed with 10 mL 1% Xylocaine - 3 fiducial gold seed markers placed, one at right base, one at left base, one at apex of prostate gland under transrectal ultrasound guidance  Post-Procedure: - Patient tolerated the procedure well - He was counseled to seek immediate medical attention if experiences any severe pain, significant bleeding, or fevers -He was also noted on bone scan to have right hydronephrosis and a follow-up CT was scheduled.  See additional office note from today.    Irineo Axon, MD

## 2023-07-15 NOTE — Progress Notes (Signed)
Surgical Physician Order Form [facility]  Dr. Irineo Axon, MD  * Scheduling expectation : Next Available  *Length of Case: 60 minutes  *Clearance needed: Per Judie Grieve  *Anticoagulation Instructions: N/A  *Aspirin Instructions: Hold Aspirin  *Post-op visit Date/Instructions:  1-2 week with pathology review  *Diagnosis: Bilateral hydronephrosis with possible proximal ureteral masses  *Procedure: Cystoscopy with bilateral retrograde pyelograms; possible bilateral ureteroscopy with ureteral biopsies; possible bilateral ureteral stent placement   Additional orders: N/A  -Admit type: OUTpatient  -Anesthesia: General  -VTE Prophylaxis Standing Order SCD's       Other:   -Standing Lab Orders Per Anesthesia    Lab other: UA&Urine Culture  -Standing Test orders EKG/Chest x-ray per Anesthesia       Test other:   - Medications:  Gentamicin per pharmacy  -Other orders:  N/A

## 2023-07-15 NOTE — Progress Notes (Signed)
   [facility] Surgical Posting Form  Surgery Date: Date: 07/26/2023  Surgeon: Dr. 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.89 Bilateral Ureteral Mass  -CPT: 74420, 52351, 52354, [id number]  Surgery: Cystoscopy with Bilateral Retrograde Pyelogram, Possible Bilateral Ureteroscopy with Possible Ureteral Biopsy, Possible Bilateral Ureteral Stent Placement   Stop Anticoagulations: No, Hold Asa for 3 days  Cardiac/Medical/Pulmonary Clearance needed: no  *Orders entered into EPIC  Date: 07/15/23   *Case booked in [place]  Date: 07/15/23  *Notified pt of Surgery: Date: 07/15/23  PRE-OP UA & CX: yes, will obtain in office on 07/18/2023  *Placed into Prior Authorization Work Angela Nevin Date: 07/15/23  Assistant/laser/rep:No

## 2023-07-18 ENCOUNTER — Ambulatory Visit: Admission: RE | Admit: 2023-07-18 | Payer: Medicare HMO | Source: Ambulatory Visit

## 2023-07-18 ENCOUNTER — Other Ambulatory Visit: Payer: Medicare HMO

## 2023-07-18 DIAGNOSIS — N2889 Other specified disorders of kidney and ureter: Secondary | ICD-10-CM | POA: Diagnosis not present

## 2023-07-18 DIAGNOSIS — Z191 Hormone sensitive malignancy status: Secondary | ICD-10-CM | POA: Diagnosis not present

## 2023-07-18 DIAGNOSIS — C61 Malignant neoplasm of prostate: Secondary | ICD-10-CM | POA: Diagnosis not present

## 2023-07-18 DIAGNOSIS — N1330 Unspecified hydronephrosis: Secondary | ICD-10-CM

## 2023-07-18 LAB — URINALYSIS, COMPLETE
Bilirubin, UA: NEGATIVE
Glucose, UA: NEGATIVE
Ketones, UA: NEGATIVE
Leukocytes,UA: NEGATIVE
Nitrite, UA: NEGATIVE
RBC, UA: NEGATIVE
Specific Gravity, UA: 1.015 (ref 1.005–1.030)
Urobilinogen, Ur: 0.2 mg/dL (ref 0.2–1.0)
pH, UA: 7 (ref 5.0–7.5)

## 2023-07-18 LAB — MICROSCOPIC EXAMINATION: Bacteria, UA: NONE SEEN

## 2023-07-20 ENCOUNTER — Other Ambulatory Visit: Payer: Self-pay

## 2023-07-20 ENCOUNTER — Encounter
Admission: RE | Admit: 2023-07-20 | Discharge: 2023-07-20 | Disposition: A | Discharge status: Home / Self Care | Payer: Medicare HMO | Source: Ambulatory Visit | Attending: Urology | Admitting: Urology

## 2023-07-20 VITALS — Ht 70.0 in | Wt 170.0 lb

## 2023-07-20 DIAGNOSIS — Z191 Hormone sensitive malignancy status: Secondary | ICD-10-CM | POA: Diagnosis not present

## 2023-07-20 DIAGNOSIS — C61 Malignant neoplasm of prostate: Secondary | ICD-10-CM | POA: Diagnosis not present

## 2023-07-20 DIAGNOSIS — Z01818 Encounter for other preprocedural examination: Secondary | ICD-10-CM

## 2023-07-20 DIAGNOSIS — I214 Non-ST elevation (NSTEMI) myocardial infarction: Secondary | ICD-10-CM

## 2023-07-20 DIAGNOSIS — I10 Essential (primary) hypertension: Secondary | ICD-10-CM

## 2023-07-20 DIAGNOSIS — I252 Old myocardial infarction: Secondary | ICD-10-CM

## 2023-07-20 DIAGNOSIS — E119 Type 2 diabetes mellitus without complications: Secondary | ICD-10-CM

## 2023-07-20 HISTORY — DX: Malignant neoplasm of prostate: C61

## 2023-07-20 HISTORY — DX: Elevated prostate specific antigen (PSA): R97.20

## 2023-07-20 HISTORY — DX: Old myocardial infarction: I25.2

## 2023-07-20 HISTORY — DX: Pure hypercholesterolemia, unspecified: E78.00

## 2023-07-20 HISTORY — DX: Atherosclerotic heart disease of native coronary artery without angina pectoris: I25.10

## 2023-07-20 HISTORY — DX: Type 2 diabetes mellitus without complications: E11.9

## 2023-07-20 HISTORY — DX: Other disorder of circulatory system: I99.8

## 2023-07-20 HISTORY — DX: Brachial plexus disorders: G54.0

## 2023-07-20 HISTORY — DX: Nicotine dependence, unspecified, uncomplicated: F17.200

## 2023-07-20 HISTORY — DX: Occlusion and stenosis of bilateral carotid arteries: I65.23

## 2023-07-20 NOTE — Patient Instructions (Addendum)
Your procedure is scheduled on: Tuesday, August 6 Report to the Registration Desk on the 1st floor of the [facility] To find out your arrival time, please call [phone] between 1PM - 3PM on: Monday August 5  If your arrival time is 6:00 am, do not arrive before that time as the [facility] entrance doors do not open until 6:00 am.  REMEMBER: Instructions that are not followed completely may result in serious medical risk, up to and including death; or upon the discretion of your surgeon and anesthesiologist your surgery may need to be rescheduled.  Do not eat food after midnight the night before surgery.  No gum chewing or hard candies.   One week prior to surgery: Stop Anti-inflammatories (NSAIDS) such as Advil, Aleve, Ibuprofen, Motrin, Naproxen, Naprosyn and Aspirin ( patient stated he was instructed per Dr. Lonna Cobb)  based products such as Excedrin, Goody's Powder, BC Powder. Stop ANY OVER THE COUNTER supplements until after surgery. MULTIPLE MINERALS   You may however, continue to take Tylenol if needed for pain up until the day of surgery.  Continue taking all prescribed medications with the exception of the following: losartan (COZAAR) stop taking the day of surgery, last dose Monday August 5     Follow recommendations from Cardiologist or PCP regarding stopping blood thinners.  TAKE ONLY THESE MEDICATIONS THE MORNING OF SURGERY WITH A SIP OF WATER:  atorvastatin (LIPITOR)      No Alcohol for 24 hours before or after surgery.  No Smoking including e-cigarettes for 24 hours before surgery.  No chewable tobacco products for at least 6 hours before surgery.  No nicotine patches on the day of surgery.  Do not use any "recreational" drugs for at least a week (preferably 2 weeks) before your surgery.  Please be advised that the combination of cocaine and anesthesia may have negative outcomes, up to and including death. If you test positive for cocaine, your surgery  will be cancelled.  On the morning of surgery brush your teeth with toothpaste and water, you may rinse your mouth with mouthwash if you wish. Do not swallow any toothpaste or mouthwash.  Do not wear jewelry, make-up, hairpins, clips or nail polish.  Do not wear lotions, powders, or perfumes.   Do not shave body hair from the neck down 48 hours before surgery.  Contact lenses, hearing aids and dentures may not be worn into surgery.  Do not bring valuables to the hospital. [facility] is not responsible for any missing/lost belongings or valuables.    Notify your doctor if there is any change in your medical condition (cold, fever, infection).  Wear comfortable clothing (specific to your surgery type) to the hospital.  After surgery, you can help prevent lung complications by doing breathing exercises.  Take deep breaths and cough every 1-2 hours.   If you are being discharged the day of surgery, you will not be allowed to drive home. You will need a responsible individual to drive you home and stay with you for 24 hours after surgery.   If you are taking public transportation, you will need to have a responsible individual with you.  Please call the Pre-admissions Testing Dept. at [phone] if you have any questions about these instructions.  Surgery Visitation Policy:  Patients having surgery or a procedure may have two visitors.  Children under the age of 82 must have an adult with them who is not the patient.

## 2023-07-21 ENCOUNTER — Encounter
Admission: RE | Admit: 2023-07-21 | Discharge: 2023-07-21 | Disposition: A | Discharge status: Home / Self Care | Payer: Medicare HMO | Source: Ambulatory Visit | Attending: Urology | Admitting: Urology

## 2023-07-21 ENCOUNTER — Inpatient Hospital Stay: Admission: RE | Admit: 2023-07-21 | Payer: Medicare HMO | Source: Ambulatory Visit

## 2023-07-21 DIAGNOSIS — I214 Non-ST elevation (NSTEMI) myocardial infarction: Secondary | ICD-10-CM

## 2023-07-21 DIAGNOSIS — I10 Essential (primary) hypertension: Secondary | ICD-10-CM | POA: Insufficient documentation

## 2023-07-21 DIAGNOSIS — Z01818 Encounter for other preprocedural examination: Secondary | ICD-10-CM

## 2023-07-21 DIAGNOSIS — Z01812 Encounter for preprocedural laboratory examination: Secondary | ICD-10-CM | POA: Diagnosis not present

## 2023-07-21 DIAGNOSIS — E119 Type 2 diabetes mellitus without complications: Secondary | ICD-10-CM | POA: Insufficient documentation

## 2023-07-21 DIAGNOSIS — I4510 Unspecified right bundle-branch block: Secondary | ICD-10-CM | POA: Insufficient documentation

## 2023-07-21 DIAGNOSIS — I252 Old myocardial infarction: Secondary | ICD-10-CM | POA: Insufficient documentation

## 2023-07-21 LAB — CBC
HCT: 38.6 % — ABNORMAL LOW (ref 39.0–52.0)
Hemoglobin: 13.4 g/dL (ref 13.0–17.0)
MCH: 33.3 pg (ref 26.0–34.0)
MCHC: 34.7 g/dL (ref 30.0–36.0)
MCV: 95.8 fL (ref 80.0–100.0)
Platelets: 215 10*3/uL (ref 150–400)
RBC: 4.03 MIL/uL — ABNORMAL LOW (ref 4.22–5.81)
RDW: 13.2 % (ref 11.5–15.5)
WBC: 4.6 10*3/uL (ref 4.0–10.5)
nRBC: 0 % (ref 0.0–0.2)

## 2023-07-21 LAB — BASIC METABOLIC PANEL
Anion gap: 9 (ref 5–15)
BUN: 21 mg/dL (ref 8–23)
CO2: 26 mmol/L (ref 22–32)
Calcium: 9.1 mg/dL (ref 8.9–10.3)
Chloride: 106 mmol/L (ref 98–111)
Creatinine, Ser: 0.99 mg/dL (ref 0.61–1.24)
GFR, Estimated: >60 mL/min (ref >60)
Glucose, Bld: 106 mg/dL — ABNORMAL HIGH (ref 70–99)
Potassium: 4.5 mmol/L (ref 3.5–5.1)
Sodium: 141 mmol/L (ref 135–145)

## 2023-07-22 ENCOUNTER — Other Ambulatory Visit: Payer: Self-pay | Admitting: *Deleted

## 2023-07-22 DIAGNOSIS — C61 Malignant neoplasm of prostate: Secondary | ICD-10-CM

## 2023-07-26 ENCOUNTER — Ambulatory Visit
Admission: RE | Admit: 2023-07-26 | Discharge: 2023-07-26 | Disposition: A | Discharge status: Home / Self Care | Payer: Medicare HMO | Attending: Urology | Admitting: Urology

## 2023-07-26 ENCOUNTER — Encounter: Payer: Self-pay | Admitting: Urology

## 2023-07-26 ENCOUNTER — Encounter
Admission: RE | Disposition: A | Discharge status: Home / Self Care | Payer: Self-pay | Source: Home / Self Care | Attending: Urology

## 2023-07-26 ENCOUNTER — Ambulatory Visit: Payer: Medicare HMO | Admitting: Anesthesiology

## 2023-07-26 ENCOUNTER — Other Ambulatory Visit: Payer: Self-pay

## 2023-07-26 ENCOUNTER — Ambulatory Visit: Payer: Medicare HMO

## 2023-07-26 ENCOUNTER — Ambulatory Visit: Payer: Medicare HMO | Admitting: Urgent Care

## 2023-07-26 DIAGNOSIS — E119 Type 2 diabetes mellitus without complications: Secondary | ICD-10-CM | POA: Insufficient documentation

## 2023-07-26 DIAGNOSIS — I2510 Atherosclerotic heart disease of native coronary artery without angina pectoris: Secondary | ICD-10-CM | POA: Diagnosis not present

## 2023-07-26 DIAGNOSIS — F17210 Nicotine dependence, cigarettes, uncomplicated: Secondary | ICD-10-CM | POA: Diagnosis not present

## 2023-07-26 DIAGNOSIS — C661 Malignant neoplasm of right ureter: Secondary | ICD-10-CM

## 2023-07-26 DIAGNOSIS — C801 Malignant (primary) neoplasm, unspecified: Secondary | ICD-10-CM | POA: Insufficient documentation

## 2023-07-26 DIAGNOSIS — I10 Essential (primary) hypertension: Secondary | ICD-10-CM | POA: Diagnosis not present

## 2023-07-26 DIAGNOSIS — N1330 Unspecified hydronephrosis: Secondary | ICD-10-CM | POA: Insufficient documentation

## 2023-07-26 DIAGNOSIS — C641 Malignant neoplasm of right kidney, except renal pelvis: Secondary | ICD-10-CM | POA: Diagnosis not present

## 2023-07-26 DIAGNOSIS — C7982 Secondary malignant neoplasm of genital organs: Secondary | ICD-10-CM | POA: Insufficient documentation

## 2023-07-26 DIAGNOSIS — C662 Malignant neoplasm of left ureter: Secondary | ICD-10-CM

## 2023-07-26 DIAGNOSIS — Z01810 Encounter for preprocedural cardiovascular examination: Secondary | ICD-10-CM | POA: Diagnosis not present

## 2023-07-26 DIAGNOSIS — N2889 Other specified disorders of kidney and ureter: Secondary | ICD-10-CM

## 2023-07-26 DIAGNOSIS — C642 Malignant neoplasm of left kidney, except renal pelvis: Secondary | ICD-10-CM | POA: Diagnosis not present

## 2023-07-26 DIAGNOSIS — I252 Old myocardial infarction: Secondary | ICD-10-CM | POA: Insufficient documentation

## 2023-07-26 DIAGNOSIS — J449 Chronic obstructive pulmonary disease, unspecified: Secondary | ICD-10-CM | POA: Insufficient documentation

## 2023-07-26 DIAGNOSIS — C7919 Secondary malignant neoplasm of other urinary organs: Secondary | ICD-10-CM | POA: Insufficient documentation

## 2023-07-26 DIAGNOSIS — F17200 Nicotine dependence, unspecified, uncomplicated: Secondary | ICD-10-CM | POA: Diagnosis not present

## 2023-07-26 DIAGNOSIS — I4510 Unspecified right bundle-branch block: Secondary | ICD-10-CM | POA: Diagnosis not present

## 2023-07-26 DIAGNOSIS — C689 Malignant neoplasm of urinary organ, unspecified: Secondary | ICD-10-CM | POA: Diagnosis not present

## 2023-07-26 HISTORY — PX: CYSTOSCOPY WITH STENT PLACEMENT: SHX5790

## 2023-07-26 HISTORY — PX: CYSTOSCOPY W/ RETROGRADES: SHX1426

## 2023-07-26 HISTORY — PX: URETEROSCOPY: SHX842

## 2023-07-26 HISTORY — PX: URETERAL BIOPSY: SHX6688

## 2023-07-26 LAB — TROPONIN I (HIGH SENSITIVITY)
Troponin I (High Sensitivity): 9 ng/L (ref <18)
Troponin I (High Sensitivity): 8 ng/L (ref <18)

## 2023-07-26 LAB — GLUCOSE, CAPILLARY: Glucose-Capillary: 121 mg/dL — ABNORMAL HIGH (ref 70–99)

## 2023-07-26 SURGERY — CYSTOSCOPY, WITH RETROGRADE PYELOGRAM
Anesthesia: General | Site: Urethra | Laterality: Bilateral

## 2023-07-26 MED ORDER — DEXAMETHASONE SODIUM PHOSPHATE 10 MG/ML IJ SOLN
INTRAMUSCULAR | Status: DC | PRN
  Administered 2023-07-26: 5 mg via INTRAVENOUS

## 2023-07-26 MED ORDER — DROPERIDOL 2.5 MG/ML IJ SOLN
0.6250 mg | Freq: Once | INTRAMUSCULAR | Status: AC | PRN
  Administered 2023-07-26: 0.625 mg via INTRAVENOUS

## 2023-07-26 MED ORDER — CHLORHEXIDINE GLUCONATE 0.12 % MT SOLN
OROMUCOSAL | Status: AC
  Filled 2023-07-26: qty 15

## 2023-07-26 MED ORDER — EPHEDRINE 5 MG/ML INJ
INTRAVENOUS | Status: AC
  Filled 2023-07-26: qty 5

## 2023-07-26 MED ORDER — OXYCODONE HCL 5 MG PO TABS
ORAL_TABLET | ORAL | Status: AC
  Filled 2023-07-26: qty 1

## 2023-07-26 MED ORDER — EPHEDRINE SULFATE (PRESSORS) 50 MG/ML IJ SOLN
INTRAMUSCULAR | Status: DC | PRN
  Administered 2023-07-26: 15 mg via INTRAVENOUS
  Administered 2023-07-26 (×2): 10 mg via INTRAVENOUS

## 2023-07-26 MED ORDER — SILODOSIN 8 MG PO CAPS: 8.0000 mg | ORAL_CAPSULE | Freq: Every day | ORAL | 0 refills | Status: DC

## 2023-07-26 MED ORDER — FENTANYL CITRATE (PF) 100 MCG/2ML IJ SOLN: 25.0000 ug | INTRAMUSCULAR | Status: DC | PRN

## 2023-07-26 MED ORDER — DROPERIDOL 2.5 MG/ML IJ SOLN
INTRAMUSCULAR | Status: AC
  Filled 2023-07-26: qty 2

## 2023-07-26 MED ORDER — TROSPIUM CHLORIDE 20 MG PO TABS: 20.0000 mg | ORAL_TABLET | Freq: Two times a day (BID) | ORAL | 0 refills | Status: DC | PRN

## 2023-07-26 MED ORDER — FENTANYL CITRATE (PF) 100 MCG/2ML IJ SOLN
INTRAMUSCULAR | Status: DC | PRN
  Administered 2023-07-26 (×4): 25 ug via INTRAVENOUS

## 2023-07-26 MED ORDER — LIDOCAINE HCL (PF) 2 % IJ SOLN
INTRAMUSCULAR | Status: AC
  Filled 2023-07-26: qty 5

## 2023-07-26 MED ORDER — NITROGLYCERIN 0.4 MG SL SUBL
0.4000 mg | SUBLINGUAL_TABLET | SUBLINGUAL | Status: DC | PRN
  Administered 2023-07-26: 0.4 mg via SUBLINGUAL

## 2023-07-26 MED ORDER — ACETAMINOPHEN 10 MG/ML IV SOLN
INTRAVENOUS | Status: DC | PRN
  Administered 2023-07-26: 1000 mg via INTRAVENOUS

## 2023-07-26 MED ORDER — DEXMEDETOMIDINE HCL IN NACL 80 MCG/20ML IV SOLN
INTRAVENOUS | Status: DC | PRN
Start: 2023-07-26 — End: 2023-07-26
  Administered 2023-07-26: 12 ug via INTRAVENOUS

## 2023-07-26 MED ORDER — IOHEXOL 180 MG/ML  SOLN
INTRAMUSCULAR | Status: DC | PRN
  Administered 2023-07-26: 40 mL

## 2023-07-26 MED ORDER — SODIUM CHLORIDE 0.9 % IR SOLN
Status: DC | PRN
  Administered 2023-07-26: 3000 mL

## 2023-07-26 MED ORDER — DEXAMETHASONE SODIUM PHOSPHATE 10 MG/ML IJ SOLN
INTRAMUSCULAR | Status: AC
  Filled 2023-07-26: qty 1

## 2023-07-26 MED ORDER — PROPOFOL 10 MG/ML IV BOLUS
INTRAVENOUS | Status: AC
  Filled 2023-07-26: qty 20

## 2023-07-26 MED ORDER — FAMOTIDINE 20 MG PO TABS
ORAL_TABLET | ORAL | Status: AC
  Filled 2023-07-26: qty 1

## 2023-07-26 MED ORDER — ACETAMINOPHEN 10 MG/ML IV SOLN: 1000.0000 mg | Freq: Once | INTRAVENOUS | Status: DC | PRN

## 2023-07-26 MED ORDER — ORAL CARE MOUTH RINSE: 15.0000 mL | Freq: Once | OROMUCOSAL | Status: AC

## 2023-07-26 MED ORDER — SODIUM CHLORIDE 0.9 % IV SOLN: INTRAVENOUS | Status: DC

## 2023-07-26 MED ORDER — OXYCODONE HCL 5 MG PO TABS
5.0000 mg | ORAL_TABLET | Freq: Once | ORAL | Status: AC | PRN
  Administered 2023-07-26: 5 mg via ORAL

## 2023-07-26 MED ORDER — LACTATED RINGERS IV SOLN: INTRAVENOUS | Status: DC | PRN

## 2023-07-26 MED ORDER — LIDOCAINE HCL (CARDIAC) PF 100 MG/5ML IV SOSY
PREFILLED_SYRINGE | INTRAVENOUS | Status: DC | PRN
  Administered 2023-07-26: 50 mg via INTRAVENOUS

## 2023-07-26 MED ORDER — ONDANSETRON HCL 4 MG/2ML IJ SOLN
INTRAMUSCULAR | Status: AC
  Filled 2023-07-26: qty 2

## 2023-07-26 MED ORDER — GENTAMICIN SULFATE 40 MG/ML IJ SOLN
5.0000 mg/kg | INTRAVENOUS | Status: AC
  Administered 2023-07-26: 400 mg via INTRAVENOUS
  Filled 2023-07-26: qty 10

## 2023-07-26 MED ORDER — OXYCODONE HCL 5 MG/5ML PO SOLN: 5.0000 mg | Freq: Once | ORAL | Status: AC | PRN

## 2023-07-26 MED ORDER — CHLORHEXIDINE GLUCONATE 0.12 % MT SOLN
15.0000 mL | Freq: Once | OROMUCOSAL | Status: AC
  Administered 2023-07-26: 15 mL via OROMUCOSAL

## 2023-07-26 MED ORDER — FAMOTIDINE 20 MG PO TABS
20.0000 mg | ORAL_TABLET | Freq: Once | ORAL | Status: AC
  Administered 2023-07-26: 20 mg via ORAL

## 2023-07-26 MED ORDER — ACETAMINOPHEN 10 MG/ML IV SOLN
INTRAVENOUS | Status: AC
  Filled 2023-07-26: qty 100

## 2023-07-26 MED ORDER — FENTANYL CITRATE (PF) 100 MCG/2ML IJ SOLN
INTRAMUSCULAR | Status: AC
  Filled 2023-07-26: qty 2

## 2023-07-26 MED ORDER — DEXMEDETOMIDINE HCL IN NACL 80 MCG/20ML IV SOLN
INTRAVENOUS | Status: AC
  Filled 2023-07-26: qty 20

## 2023-07-26 MED ORDER — PROPOFOL 10 MG/ML IV BOLUS
INTRAVENOUS | Status: DC | PRN
  Administered 2023-07-26: 50 mg via INTRAVENOUS
  Administered 2023-07-26: 100 mg via INTRAVENOUS
  Administered 2023-07-26: 150 mg via INTRAVENOUS

## 2023-07-26 MED ORDER — NITROGLYCERIN 0.4 MG SL SUBL
SUBLINGUAL_TABLET | SUBLINGUAL | Status: AC
  Filled 2023-07-26: qty 1

## 2023-07-26 MED ORDER — ONDANSETRON HCL 4 MG/2ML IJ SOLN
INTRAMUSCULAR | Status: DC | PRN
  Administered 2023-07-26: 4 mg via INTRAVENOUS

## 2023-07-26 MED ORDER — PROMETHAZINE HCL 25 MG/ML IJ SOLN: 6.2500 mg | INTRAMUSCULAR | Status: DC | PRN

## 2023-07-26 SURGICAL SUPPLY — 35 items
BAG DRAIN SIEMENS DORNER NS (MISCELLANEOUS) ×1 IMPLANT
BAG DRN NS LF (MISCELLANEOUS) ×1
BAG PRESSURE INF REUSE 3000 (BAG) ×1 IMPLANT
BRUSH SCRUB EZ 1% IODOPHOR (MISCELLANEOUS) ×1 IMPLANT
BRUSH SCRUB EZ 4% CHG (MISCELLANEOUS) ×1 IMPLANT
CATH URET FLEX-TIP 2 LUMEN 10F (CATHETERS) ×1 IMPLANT
CATH URETL OPEN 5X70 (CATHETERS) ×1 IMPLANT
CATH URETL OPEN END 6X70 (CATHETERS) ×1 IMPLANT
CNTNR URN SCR LID CUP LEK RST (MISCELLANEOUS) ×1 IMPLANT
CONT SPEC 4OZ STRL OR WHT (MISCELLANEOUS) ×2
DRAPE UTILITY 15X26 TOWEL STRL (DRAPES) ×1 IMPLANT
DRSG TELFA 3X4 N-ADH STERILE (GAUZE/BANDAGES/DRESSINGS) ×1 IMPLANT
FORCEPS BIOP PIRANHA Y (CUTTING FORCEPS) IMPLANT
GLOVE BIOGEL PI IND STRL 7.5 (GLOVE) ×1 IMPLANT
GOWN STRL REUS W/ TWL LRG LVL3 (GOWN DISPOSABLE) ×2 IMPLANT
GOWN STRL REUS W/ TWL XL LVL3 (GOWN DISPOSABLE) ×1 IMPLANT
GOWN STRL REUS W/TWL LRG LVL3 (GOWN DISPOSABLE) ×2
GOWN STRL REUS W/TWL XL LVL3 (GOWN DISPOSABLE) ×1
GUIDEWIRE GREEN .038 145CM (MISCELLANEOUS) ×1 IMPLANT
GUIDEWIRE STR DUAL SENSOR (WIRE) ×1 IMPLANT
GUIDEWIRE STR ZIPWIRE 035X150 (MISCELLANEOUS) IMPLANT
IV NS IRRIG 3000ML ARTHROMATIC (IV SOLUTION) ×1 IMPLANT
KIT TURNOVER CYSTO (KITS) ×1 IMPLANT
NDL SAFETY ECLIP 18X1.5 (MISCELLANEOUS) ×1 IMPLANT
PACK CYSTO AR (MISCELLANEOUS) ×1 IMPLANT
SET CYSTO W/LG BORE CLAMP LF (SET/KITS/TRAYS/PACK) ×1 IMPLANT
SHEATH NAVIGATOR HD 12/14X36 (SHEATH) ×1 IMPLANT
STENT URET 6FRX24 CONTOUR (STENTS) IMPLANT
STENT URET 6FRX26 CONTOUR (STENTS) IMPLANT
SURGILUBE 2OZ TUBE FLIPTOP (MISCELLANEOUS) ×1 IMPLANT
SYR TOOMEY IRRIG 70ML (MISCELLANEOUS) ×1
SYRINGE TOOMEY IRRIG 70ML (MISCELLANEOUS) IMPLANT
VALVE UROSEAL ADJ ENDO (VALVE) IMPLANT
WATER STERILE IRR 1000ML POUR (IV SOLUTION) ×1 IMPLANT
WATER STERILE IRR 500ML POUR (IV SOLUTION) ×1 IMPLANT

## 2023-07-26 NOTE — Discharge Instructions (Signed)
DISCHARGE INSTRUCTIONS FOR KIDNEY STONE/URETERAL STENT   MEDICATIONS:  1. Resume all your other meds from home.  2.  AZO (over-the-counter) can help with the burning/stinging when you urinate. 3.  Prescriptions for silodosin and trospium which will help with bladder irritation were sent to your pharmacy  ACTIVITY:  1. May resume regular activities in 24 hours. 2. No driving while on narcotic pain medications  3. Drink plenty of water  4. Continue to walk at home - you can still get blood clots when you are at home, so keep active, but don't over do it.  5. May return to work/school tomorrow or when you feel ready    SIGNS/SYMPTOMS TO CALL:  Common postoperative symptoms include urinary frequency, urgency, bladder spasm and blood in the urine  Please call us if you have a fever greater than 101.5, uncontrolled nausea/vomiting, uncontrolled pain, dizziness, unable to urinate, excessively bloody urine, chest pain, shortness of breath, leg swelling, leg pain, or any other concerns or questions.   You can reach [place] at [phone].   FOLLOW-UP:  1. You will be contacted with your biopsy results and follow-up appointment will be scheduled at that time

## 2023-07-26 NOTE — Anesthesia Procedure Notes (Addendum)
Procedure Name: LMA Insertion Date/Time: 07/26/2023 7:38 AM  Performed by: Jeannene Patella, CRNAPre-anesthesia Checklist: Patient identified, Emergency Drugs available, Suction available, Patient being monitored and Timeout performed Patient Re-evaluated:Patient Re-evaluated prior to induction Oxygen Delivery Method: Circle system utilized Preoxygenation: Pre-oxygenation with 100% oxygen Induction Type: IV induction LMA: LMA inserted LMA Size: 4.0 Number of attempts: 1 Placement Confirmation: positive ETCO2 and breath sounds checked- equal and bilateral Tube secured with: Tape Dental Injury: Teeth and Oropharynx as per pre-operative assessment  Comments: Soft guaze roll left molars

## 2023-07-26 NOTE — Transfer of Care (Signed)
Immediate Anesthesia Transfer of Care Note  Patient: Seth Rivera  Procedure(s) Performed: CYSTOSCOPY WITH RETROGRADE PYELOGRAM (Bilateral: Urethra) URETERAL BIOPSY (Bilateral: Urethra) DIAGNOSTIC URETEROSCOPY (Bilateral: Urethra) CYSTOSCOPY WITH STENT PLACEMENT (Bilateral: Urethra)  Patient Location: PACU  Anesthesia Type:General  Level of Consciousness: drowsy and patient cooperative  Airway & Oxygen Therapy: Patient Spontanous Breathing and Patient connected to face mask oxygen  Post-op Assessment: Report given to RN and Post -op Vital signs reviewed and stable  Post vital signs: Reviewed and stable  Last Vitals:  Vitals Value Taken Time  BP 162/79 07/26/23 0851  Temp    Pulse 63 07/26/23 0855  Resp 16 07/26/23 0855  SpO2 100 % 07/26/23 0855  Vitals shown include unfiled device data.  Last Pain:  Vitals:   07/26/23 0616  TempSrc: Oral  PainSc: 0-No pain         Complications: No notable events documented.

## 2023-07-26 NOTE — Anesthesia Preprocedure Evaluation (Addendum)
Anesthesia Evaluation  Patient identified by MRN, date of birth, ID band Patient awake    Reviewed: Allergy & Precautions, H&P , NPO status , Patient's Chart, lab work & pertinent test results  Airway Mallampati: II  TM Distance: >3 FB Neck ROM: full    Dental  (+) Missing,    Pulmonary COPD, Current Smoker and Patient abstained from smoking.   Pulmonary exam normal        Cardiovascular Exercise Tolerance: Good hypertension, (-) angina + CAD, + Past MI and + Peripheral Vascular Disease (Carotid Duplex ultrasound shows 40-59% stenosis bilaterally.)  Normal cardiovascular exam+ dysrhythmias (RBBB 07/21/23)      Neuro/Psych negative neurological ROS  negative psych ROS   GI/Hepatic negative GI ROS, Neg liver ROS,,,  Endo/Other  negative endocrine ROS    Renal/GU    Malignant neoplasm of prostate     Musculoskeletal   Abdominal Normal abdominal exam  (+)   Peds  Hematology negative hematology ROS (+)   Anesthesia Other Findings Past Medical History: No date: Allergic rhinitis No date: Bilateral carotid artery stenosis No date: Bladder-neck obstruction No date: Carotid stenosis No date: Colon polyp No date: COPD (chronic obstructive pulmonary disease) (HCC) No date: Coronary artery disease No date: Coronary atherosclerosis of native coronary artery No date: Diabetes mellitus without complication (HCC)     Comment:  NO medications (patient state borderline diabetes) No date: Elevated PSA No date: Gastric ulcer No date: History of non-ST elevation myocardial infarction (NSTEMI) No date: Hyperlipidemia No date: Hypertension No date: Ischemic finger No date: Malignant neoplasm of prostate (HCC)     Comment:  stage ll C No date: Myocardial infarction (HCC) 05/20/2016: Personal history of tobacco use, presenting hazards to  health No date: Pulmonary granuloma (HCC) No date: Pulmonary granuloma (HCC) No date: Pure  hypercholesterolemia No date: Restless legs syndrome (RLS) No date: Squamous cell carcinoma in situ No date: Thoracic outlet syndrome No date: Tobacco use disorder No date: Tubular adenoma of colon No date: Tubular adenoma of colon No date: Type 2 diabetes mellitus without complication (HCC)  Past Surgical History: 1949: APPENDECTOMY No date: CAROTID ENDARTERECTOMY 2014: CATARACT EXTRACTION 2014: COLONOSCOPY 09/06/2016: COLONOSCOPY WITH PROPOFOL; N/A     Comment:  Procedure: COLONOSCOPY WITH PROPOFOL;  Surgeon: Scot Jun, MD;  Location: [facility] ENDOSCOPY;  Service:               Endoscopy;  Laterality: N/A; 02/28/2020: COLONOSCOPY WITH PROPOFOL; N/A     Comment:  Procedure: COLONOSCOPY WITH PROPOFOL;  Surgeon: Toledo,               Boykin Nearing, MD;  Location: [facility] ENDOSCOPY;  Service:               Gastroenterology;  Laterality: N/A; 1999: CORONARY ANGIOPLASTY 07/25/2018: THORACIC OUTLET SURGERY     Comment:  left first rib resection, dissection of subclavian               artery, anterior and middle scalenectomy     Reproductive/Obstetrics negative OB ROS                             Anesthesia Physical Anesthesia Plan  ASA: 3  Anesthesia Plan: General LMA   Post-op Pain Management: Minimal or no pain anticipated   Induction: Intravenous  PONV Risk Score and Plan: Dexamethasone, Ondansetron and Treatment  may vary due to age or medical condition  Airway Management Planned: LMA  Additional Equipment:   Intra-op Plan:   Post-operative Plan: Extubation in OR  Informed Consent: I have reviewed the patients History and Physical, chart, labs and discussed the procedure including the risks, benefits and alternatives for the proposed anesthesia with the patient or authorized representative who has indicated his/her understanding and acceptance.     Dental Advisory Given  Plan Discussed with: Anesthesiologist, CRNA and  Surgeon  Anesthesia Plan Comments:         Anesthesia Quick Evaluation

## 2023-07-26 NOTE — Interval H&P Note (Signed)
History and Physical Interval Note:  CV:RRR Lungs:clear  07/26/2023 7:23 AM  Seth Rivera  has presented today for surgery, with the diagnosis of Bilateral Hydronephrosis, Ureteral Mass.  The various methods of treatment have been discussed with the patient and family. After consideration of risks, benefits and other options for treatment, the patient has consented to  Procedure(s): CYSTOSCOPY WITH RETROGRADE PYELOGRAM (Bilateral) URETERAL BIOPSY (Bilateral) DIAGNOSTIC URETEROSCOPY (Bilateral) CYSTOSCOPY WITH STENT PLACEMENT (Bilateral) as a surgical intervention.  The patient's history has been reviewed, patient examined, no change in status, stable for surgery.  I have reviewed the patient's chart and labs.  Questions were answered to the patient's satisfaction.      C 

## 2023-07-26 NOTE — Op Note (Signed)
Preoperative diagnosis:  Bilateral hydronephrosis with bilateral proximal ureteral lesions  Postoperative diagnosis:  Bilateral proximal ureteral tumors Bilateral hydronephrosis secondary to above (R >L)  Procedure:  Cystoscopy w/ bilateral retrograde pyelograms Bilateral ureteroscopy with biopsies Bilateral ureteral stent placement (59F/26 cm)   Surgeon: Lorin Picket C. , M.D.  Anesthesia: General  Complications: None  Intraoperative findings:  Cystoscopy-urethra normal end, without stricture.  Prostate with prominent lateral lobe enlargement Ureteroscopy-right ureteroscopy with papillary tumor proximal ureter suspicious for urothelial carcinoma; left ureteroscopy with papillary tumor proximal ureter suspicious for urothelial carcinoma Right retrograde pyelogram with filling defect right proximal ureter with goblet sign and moderate hydronephrosis Left retrograde pyelogram with filling defect proximal ureter with goblet sign and mild hydronephrosis  EBL: Minimal  Specimens: Saline barbotage right renal pelvis for cytology Saline barbotage left renal pelvis for cytology Biopsies right proximal ureteral tumor Biopsies left proximal ureteral tumor   Indication: Seth Rivera is a 77 y.o. 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  A CT of the abdomen pelvis showed bilateral hydronephrosis with enhancing proximal ureteral lesions bilaterally.  After reviewing the management options for treatment, the patient elected to proceed with the above surgical procedure(s). We have discussed the potential benefits and risks of the procedure, side effects of the proposed treatment, the likelihood of the patient achieving the goals of the procedure, and any potential problems that might occur during the procedure or recuperation. Informed consent has been obtained.  Description of procedure:  The patient was taken to  the operating room and general anesthesia was induced.  The patient was placed in the dorsal lithotomy position, prepped and draped in the usual sterile fashion, and preoperative antibiotics were administered. A preoperative time-out was performed.   A 21 French cystoscope was lubricated, placed per urethra and advanced proximally under direct vision with findings as described above.  Panendoscopy was performed with findings as described above.  A 59F open-ended ureteral catheter was placed to the cystoscope and into the right ureteral orifice and retrograde pyelogram was performed with findings as described above.  A Sensor wire was then placed through the ureteral catheter however would not advance into the renal pelvis.  The ureteral catheter was advanced to the proximal ureter and repeat retrograde pyelogram was performed to better identify the renal pelvis.  A Zip-w 4 ire was able to be advanced into the renal pelvis.  The ureteral catheter was then advanced over the Zip wire and urine was collected for cytology.  A Sensor wire was placed through the ureteral catheter followed by catheter removal.  A 4.5 French semirigid ureteroscope was then advanced per urethra and into the right ureter.  The ureteroscope was advanced proximally and the ureter was normal in appearance until tumor was identified in the proximal ureter as described above.  Biopsies x 4 were taken with a Piranha forceps with good tissue obtained.  Hemostasis was noted to be adequate.  The ureteroscope was removed.  A 59F/26 cm contour ureteral stent was then placed under fluoroscopic guidance with a good curl noted in upper pole calyx and in the bladder under direct vision.  The cystoscope was removed and repassed and a left retrograde pyelogram was performed in a similar fashion with findings as described above.  A 0.038 Sensor was able to be advanced into the renal pelvis.  The catheter was again advanced over the guidewire and urine  from the left renal pelvis was  collected and sent for cytology.    The ureteral catheter was removed and the 4.5 French semirigid ureteroscope was repassed and advanced into the left ureter without difficulty.  The scope was advanced proximally and tumor was identified in the proximal ureter as described above.  Biopsies x 4 were then taken with a Piranha forceps.  No significant bleeding was noted.  The ureteroscope was removed and a 74F/26 cm contour ureteral stent was placed in a similar fashion under fluoroscopic guidance.  The cystoscope was repassed.  No significant bloody efflux was noted from the UOs.  The bladder was emptied and the cystoscope was removed.  The patient appeared to tolerate the procedure well and without complications.  After anesthetic reversal the patient was transported to the PACU in stable condition.   Plan: He will be notified with the biopsy results and further follow-up will be arranged at that time  He was Irineo Axon, MD

## 2023-07-26 NOTE — Anesthesia Postprocedure Evaluation (Signed)
Anesthesia Post Note  Patient: Seth Rivera  Procedure(s) Performed: CYSTOSCOPY WITH RETROGRADE PYELOGRAM (Bilateral: Urethra) URETERAL BIOPSY (Bilateral: Urethra) DIAGNOSTIC URETEROSCOPY (Bilateral: Urethra) CYSTOSCOPY WITH STENT PLACEMENT (Bilateral: Urethra)  Patient location during evaluation: PACU Anesthesia Type: General Level of consciousness: awake and alert Pain management: pain level controlled Vital Signs Assessment: post-procedure vital signs reviewed and stable Respiratory status: spontaneous breathing, nonlabored ventilation and respiratory function stable Cardiovascular status: blood pressure returned to baseline and stable Postop Assessment: no apparent nausea or vomiting Anesthetic complications: no Comments: Chest pain in pacu upon emergence of anesthesia. Resolved quickly with nitroglycerin. Troponin x2 negative. Heart score reviewed as 6. Cardiology on call, Dr. Melton Alar, called and recommended discharge with outpatient followup within the week. Pt made aware and was given return to hospital precautions of further chest pain or shortness of breath.    No notable events documented.   Last Vitals:  Vitals:   07/26/23 1145 07/26/23 1200  BP: (!) 161/74   Pulse: (!) 57 (!) 58  Resp: 18 16  Temp:    SpO2: 98% 99%    Last Pain:  Vitals:   07/26/23 1100  TempSrc:   PainSc: 0-No pain                 Foye Deer

## 2023-07-27 ENCOUNTER — Encounter: Payer: Self-pay | Admitting: Urology

## 2023-07-27 ENCOUNTER — Ambulatory Visit
Admission: RE | Admit: 2023-07-27 | Discharge: 2023-07-27 | Disposition: A | Discharge status: Home / Self Care | Payer: Medicare HMO | Source: Ambulatory Visit | Attending: Radiation Oncology | Admitting: Radiation Oncology

## 2023-07-27 DIAGNOSIS — C61 Malignant neoplasm of prostate: Secondary | ICD-10-CM | POA: Insufficient documentation

## 2023-07-28 ENCOUNTER — Encounter: Payer: Self-pay | Admitting: Urology

## 2023-07-28 ENCOUNTER — Ambulatory Visit
Admission: RE | Admit: 2023-07-28 | Discharge: 2023-07-28 | Disposition: A | Discharge status: Home / Self Care | Payer: Medicare HMO | Source: Ambulatory Visit | Attending: Radiation Oncology | Admitting: Radiation Oncology

## 2023-07-28 ENCOUNTER — Other Ambulatory Visit: Payer: Self-pay

## 2023-07-28 DIAGNOSIS — Z510 Encounter for antineoplastic radiation therapy: Secondary | ICD-10-CM | POA: Diagnosis not present

## 2023-07-28 DIAGNOSIS — Z191 Hormone sensitive malignancy status: Secondary | ICD-10-CM | POA: Diagnosis not present

## 2023-07-28 DIAGNOSIS — C61 Malignant neoplasm of prostate: Secondary | ICD-10-CM | POA: Diagnosis not present

## 2023-07-28 LAB — RAD ONC ARIA SESSION SUMMARY
Course Elapsed Days: 0
Plan Fractions Treated to Date: 1
Plan Prescribed Dose Per Fraction: 2 Gy
Plan Total Fractions Prescribed: 40
Plan Total Prescribed Dose: 80 Gy
Reference Point Dosage Given to Date: 2 Gy
Reference Point Session Dosage Given: 2 Gy
Session Number: 1

## 2023-07-29 ENCOUNTER — Other Ambulatory Visit: Payer: Self-pay | Admitting: Urology

## 2023-07-29 ENCOUNTER — Other Ambulatory Visit: Payer: Self-pay

## 2023-07-29 ENCOUNTER — Ambulatory Visit
Admission: RE | Admit: 2023-07-29 | Discharge: 2023-07-29 | Disposition: A | Discharge status: Home / Self Care | Payer: Medicare HMO | Source: Ambulatory Visit | Attending: Radiation Oncology | Admitting: Radiation Oncology

## 2023-07-29 ENCOUNTER — Telehealth: Payer: Self-pay | Admitting: Urology

## 2023-07-29 DIAGNOSIS — C61 Malignant neoplasm of prostate: Secondary | ICD-10-CM | POA: Diagnosis not present

## 2023-07-29 DIAGNOSIS — Z191 Hormone sensitive malignancy status: Secondary | ICD-10-CM | POA: Diagnosis not present

## 2023-07-29 DIAGNOSIS — C689 Malignant neoplasm of urinary organ, unspecified: Secondary | ICD-10-CM

## 2023-07-29 DIAGNOSIS — Z510 Encounter for antineoplastic radiation therapy: Secondary | ICD-10-CM | POA: Diagnosis not present

## 2023-07-29 LAB — RAD ONC ARIA SESSION SUMMARY
Course Elapsed Days: 1
Plan Fractions Treated to Date: 2
Plan Prescribed Dose Per Fraction: 2 Gy
Plan Total Fractions Prescribed: 40
Plan Total Prescribed Dose: 80 Gy
Reference Point Dosage Given to Date: 4 Gy
Reference Point Session Dosage Given: 2 Gy
Session Number: 2

## 2023-07-29 MED ORDER — TROSPIUM CHLORIDE 20 MG PO TABS: 20.0000 mg | ORAL_TABLET | Freq: Two times a day (BID) | ORAL | 0 refills | Status: DC | PRN

## 2023-07-29 NOTE — Telephone Encounter (Signed)
I contacted Seth Rivera to discuss his biopsy report.  Incidentally noted to have right hydronephrosis on bone scan imaging for high-grade prostate cancer.  CT showed bilateral hydronephrosis and enhancing proximal ureteral masses.  He underwent bilateral RPG/ureteroscopy 07/26/2023 with findings of papillary tumor proximal ureter bilaterally.  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  Urine cytology from the renal pelvis bilaterally showed high-grade urothelial carcinoma.  Pathology report was discussed in detail.  I recommended referral to [facility] to discuss treatment options.    Referral was placed.  I have updated Dr. Rushie Chestnut regarding this diagnosis.

## 2023-08-01 ENCOUNTER — Other Ambulatory Visit: Payer: Self-pay

## 2023-08-01 ENCOUNTER — Ambulatory Visit
Admission: RE | Admit: 2023-08-01 | Discharge: 2023-08-01 | Disposition: A | Discharge status: Home / Self Care | Payer: Medicare HMO | Source: Ambulatory Visit | Attending: Radiation Oncology | Admitting: Radiation Oncology

## 2023-08-01 DIAGNOSIS — C61 Malignant neoplasm of prostate: Secondary | ICD-10-CM | POA: Diagnosis not present

## 2023-08-01 DIAGNOSIS — Z191 Hormone sensitive malignancy status: Secondary | ICD-10-CM | POA: Diagnosis not present

## 2023-08-01 DIAGNOSIS — Z510 Encounter for antineoplastic radiation therapy: Secondary | ICD-10-CM | POA: Diagnosis not present

## 2023-08-01 LAB — RAD ONC ARIA SESSION SUMMARY
Course Elapsed Days: 4
Plan Fractions Treated to Date: 3
Plan Prescribed Dose Per Fraction: 2 Gy
Plan Total Fractions Prescribed: 40
Plan Total Prescribed Dose: 80 Gy
Reference Point Dosage Given to Date: 6 Gy
Reference Point Session Dosage Given: 2 Gy
Session Number: 3

## 2023-08-02 ENCOUNTER — Ambulatory Visit
Admission: RE | Admit: 2023-08-02 | Discharge: 2023-08-02 | Disposition: A | Discharge status: Home / Self Care | Payer: Medicare HMO | Source: Ambulatory Visit | Attending: Radiation Oncology | Admitting: Radiation Oncology

## 2023-08-02 ENCOUNTER — Other Ambulatory Visit: Payer: Self-pay

## 2023-08-02 ENCOUNTER — Other Ambulatory Visit: Payer: Self-pay | Admitting: *Deleted

## 2023-08-02 DIAGNOSIS — Z510 Encounter for antineoplastic radiation therapy: Secondary | ICD-10-CM | POA: Diagnosis not present

## 2023-08-02 DIAGNOSIS — Z191 Hormone sensitive malignancy status: Secondary | ICD-10-CM | POA: Diagnosis not present

## 2023-08-02 DIAGNOSIS — C61 Malignant neoplasm of prostate: Secondary | ICD-10-CM | POA: Diagnosis not present

## 2023-08-02 LAB — RAD ONC ARIA SESSION SUMMARY
Course Elapsed Days: 5
Plan Fractions Treated to Date: 4
Plan Prescribed Dose Per Fraction: 2 Gy
Plan Total Fractions Prescribed: 40
Plan Total Prescribed Dose: 80 Gy
Reference Point Dosage Given to Date: 8 Gy
Reference Point Session Dosage Given: 2 Gy
Session Number: 4

## 2023-08-02 MED ORDER — MIRABEGRON ER 50 MG PO TB24: 50.0000 mg | ORAL_TABLET | Freq: Every day | ORAL | 1 refills | Status: DC

## 2023-08-03 ENCOUNTER — Ambulatory Visit: Admission: RE | Admit: 2023-08-03 | Payer: Medicare HMO | Source: Ambulatory Visit

## 2023-08-03 ENCOUNTER — Inpatient Hospital Stay: Payer: Medicare HMO | Attending: Radiation Oncology

## 2023-08-03 ENCOUNTER — Other Ambulatory Visit: Payer: Self-pay

## 2023-08-03 DIAGNOSIS — C61 Malignant neoplasm of prostate: Secondary | ICD-10-CM | POA: Insufficient documentation

## 2023-08-03 DIAGNOSIS — Z191 Hormone sensitive malignancy status: Secondary | ICD-10-CM | POA: Diagnosis not present

## 2023-08-03 DIAGNOSIS — Z510 Encounter for antineoplastic radiation therapy: Secondary | ICD-10-CM | POA: Diagnosis not present

## 2023-08-03 LAB — RAD ONC ARIA SESSION SUMMARY
Course Elapsed Days: 6
Plan Fractions Treated to Date: 5
Plan Prescribed Dose Per Fraction: 2 Gy
Plan Total Fractions Prescribed: 40
Plan Total Prescribed Dose: 80 Gy
Reference Point Dosage Given to Date: 10 Gy
Reference Point Session Dosage Given: 2 Gy
Session Number: 5

## 2023-08-03 LAB — CBC (CANCER CENTER ONLY)
HCT: 40.9 % (ref 39.0–52.0)
Hemoglobin: 13.7 g/dL (ref 13.0–17.0)
MCH: 33.2 pg (ref 26.0–34.0)
MCHC: 33.5 g/dL (ref 30.0–36.0)
MCV: 99 fL (ref 80.0–100.0)
Platelet Count: 195 10*3/uL (ref 150–400)
RBC: 4.13 MIL/uL — ABNORMAL LOW (ref 4.22–5.81)
RDW: 13 % (ref 11.5–15.5)
WBC Count: 4 10*3/uL (ref 4.0–10.5)
nRBC: 0 % (ref 0.0–0.2)

## 2023-08-04 ENCOUNTER — Ambulatory Visit
Admission: RE | Admit: 2023-08-04 | Discharge: 2023-08-04 | Disposition: A | Discharge status: Home / Self Care | Payer: Medicare HMO | Source: Ambulatory Visit | Attending: Radiation Oncology | Admitting: Radiation Oncology

## 2023-08-04 ENCOUNTER — Other Ambulatory Visit: Payer: Self-pay

## 2023-08-04 DIAGNOSIS — M545 Low back pain, unspecified: Secondary | ICD-10-CM | POA: Diagnosis not present

## 2023-08-04 DIAGNOSIS — G8929 Other chronic pain: Secondary | ICD-10-CM | POA: Diagnosis not present

## 2023-08-04 DIAGNOSIS — C61 Malignant neoplasm of prostate: Secondary | ICD-10-CM | POA: Diagnosis not present

## 2023-08-04 DIAGNOSIS — Z510 Encounter for antineoplastic radiation therapy: Secondary | ICD-10-CM | POA: Diagnosis not present

## 2023-08-04 DIAGNOSIS — Z191 Hormone sensitive malignancy status: Secondary | ICD-10-CM | POA: Diagnosis not present

## 2023-08-04 LAB — RAD ONC ARIA SESSION SUMMARY
Course Elapsed Days: 7
Plan Fractions Treated to Date: 6
Plan Prescribed Dose Per Fraction: 2 Gy
Plan Total Fractions Prescribed: 40
Plan Total Prescribed Dose: 80 Gy
Reference Point Dosage Given to Date: 12 Gy
Reference Point Session Dosage Given: 2 Gy
Session Number: 6

## 2023-08-05 ENCOUNTER — Ambulatory Visit
Admission: RE | Admit: 2023-08-05 | Discharge: 2023-08-05 | Disposition: A | Discharge status: Home / Self Care | Payer: Medicare HMO | Source: Ambulatory Visit | Attending: Radiation Oncology | Admitting: Radiation Oncology

## 2023-08-05 ENCOUNTER — Other Ambulatory Visit: Payer: Self-pay

## 2023-08-05 ENCOUNTER — Ambulatory Visit: Payer: Medicare HMO | Admitting: Urology

## 2023-08-05 ENCOUNTER — Other Ambulatory Visit: Payer: Self-pay | Admitting: *Deleted

## 2023-08-05 ENCOUNTER — Encounter: Payer: Self-pay | Admitting: Urology

## 2023-08-05 DIAGNOSIS — Z510 Encounter for antineoplastic radiation therapy: Secondary | ICD-10-CM | POA: Diagnosis not present

## 2023-08-05 DIAGNOSIS — C61 Malignant neoplasm of prostate: Secondary | ICD-10-CM | POA: Diagnosis not present

## 2023-08-05 DIAGNOSIS — Z191 Hormone sensitive malignancy status: Secondary | ICD-10-CM | POA: Diagnosis not present

## 2023-08-05 LAB — RAD ONC ARIA SESSION SUMMARY
Course Elapsed Days: 8
Plan Fractions Treated to Date: 7
Plan Prescribed Dose Per Fraction: 2 Gy
Plan Total Fractions Prescribed: 40
Plan Total Prescribed Dose: 80 Gy
Reference Point Dosage Given to Date: 14 Gy
Reference Point Session Dosage Given: 2 Gy
Session Number: 7

## 2023-08-05 MED ORDER — GEMTESA 75 MG PO TABS: 75.0000 mg | ORAL_TABLET | Freq: Every day | ORAL | Status: DC

## 2023-08-05 NOTE — Telephone Encounter (Signed)
Trospium was deny , patient can try gemtesa , oxybutynin, tolterodine er

## 2023-08-08 ENCOUNTER — Ambulatory Visit
Admission: RE | Admit: 2023-08-08 | Discharge: 2023-08-08 | Disposition: A | Discharge status: Home / Self Care | Payer: Medicare HMO | Source: Ambulatory Visit | Attending: Radiation Oncology | Admitting: Radiation Oncology

## 2023-08-08 ENCOUNTER — Other Ambulatory Visit: Payer: Self-pay | Admitting: Physician Assistant

## 2023-08-08 ENCOUNTER — Other Ambulatory Visit: Payer: Self-pay

## 2023-08-08 DIAGNOSIS — C689 Malignant neoplasm of urinary organ, unspecified: Secondary | ICD-10-CM

## 2023-08-08 DIAGNOSIS — Z510 Encounter for antineoplastic radiation therapy: Secondary | ICD-10-CM | POA: Diagnosis not present

## 2023-08-08 DIAGNOSIS — Z191 Hormone sensitive malignancy status: Secondary | ICD-10-CM | POA: Diagnosis not present

## 2023-08-08 DIAGNOSIS — C61 Malignant neoplasm of prostate: Secondary | ICD-10-CM | POA: Diagnosis not present

## 2023-08-08 LAB — RAD ONC ARIA SESSION SUMMARY
Course Elapsed Days: 11
Plan Fractions Treated to Date: 8
Plan Prescribed Dose Per Fraction: 2 Gy
Plan Total Fractions Prescribed: 40
Plan Total Prescribed Dose: 80 Gy
Reference Point Dosage Given to Date: 16 Gy
Reference Point Session Dosage Given: 2 Gy
Session Number: 8

## 2023-08-09 ENCOUNTER — Other Ambulatory Visit: Payer: Self-pay

## 2023-08-09 ENCOUNTER — Ambulatory Visit
Admission: RE | Admit: 2023-08-09 | Discharge: 2023-08-09 | Disposition: A | Discharge status: Home / Self Care | Payer: Medicare HMO | Source: Ambulatory Visit | Attending: Radiation Oncology | Admitting: Radiation Oncology

## 2023-08-09 DIAGNOSIS — Z191 Hormone sensitive malignancy status: Secondary | ICD-10-CM | POA: Diagnosis not present

## 2023-08-09 DIAGNOSIS — Z510 Encounter for antineoplastic radiation therapy: Secondary | ICD-10-CM | POA: Diagnosis not present

## 2023-08-09 DIAGNOSIS — C61 Malignant neoplasm of prostate: Secondary | ICD-10-CM | POA: Diagnosis not present

## 2023-08-09 LAB — RAD ONC ARIA SESSION SUMMARY
Course Elapsed Days: 12
Plan Fractions Treated to Date: 9
Plan Prescribed Dose Per Fraction: 2 Gy
Plan Total Fractions Prescribed: 40
Plan Total Prescribed Dose: 80 Gy
Reference Point Dosage Given to Date: 18 Gy
Reference Point Session Dosage Given: 2 Gy
Session Number: 9

## 2023-08-10 ENCOUNTER — Ambulatory Visit
Admission: RE | Admit: 2023-08-10 | Discharge: 2023-08-10 | Disposition: A | Discharge status: Home / Self Care | Payer: Medicare HMO | Source: Ambulatory Visit | Attending: Radiation Oncology | Admitting: Radiation Oncology

## 2023-08-10 ENCOUNTER — Other Ambulatory Visit: Payer: Self-pay

## 2023-08-10 DIAGNOSIS — Z510 Encounter for antineoplastic radiation therapy: Secondary | ICD-10-CM | POA: Diagnosis not present

## 2023-08-10 DIAGNOSIS — Z191 Hormone sensitive malignancy status: Secondary | ICD-10-CM | POA: Diagnosis not present

## 2023-08-10 DIAGNOSIS — C61 Malignant neoplasm of prostate: Secondary | ICD-10-CM | POA: Diagnosis not present

## 2023-08-10 LAB — RAD ONC ARIA SESSION SUMMARY
Course Elapsed Days: 13
Plan Fractions Treated to Date: 10
Plan Prescribed Dose Per Fraction: 2 Gy
Plan Total Fractions Prescribed: 40
Plan Total Prescribed Dose: 80 Gy
Reference Point Dosage Given to Date: 20 Gy
Reference Point Session Dosage Given: 2 Gy
Session Number: 10

## 2023-08-11 ENCOUNTER — Other Ambulatory Visit: Payer: Self-pay

## 2023-08-11 ENCOUNTER — Ambulatory Visit
Admission: RE | Admit: 2023-08-11 | Discharge: 2023-08-11 | Disposition: A | Discharge status: Home / Self Care | Payer: Medicare HMO | Source: Ambulatory Visit | Attending: Radiation Oncology | Admitting: Radiation Oncology

## 2023-08-11 DIAGNOSIS — Z510 Encounter for antineoplastic radiation therapy: Secondary | ICD-10-CM | POA: Diagnosis not present

## 2023-08-11 DIAGNOSIS — C61 Malignant neoplasm of prostate: Secondary | ICD-10-CM | POA: Diagnosis not present

## 2023-08-11 DIAGNOSIS — Z191 Hormone sensitive malignancy status: Secondary | ICD-10-CM | POA: Diagnosis not present

## 2023-08-11 LAB — RAD ONC ARIA SESSION SUMMARY
Course Elapsed Days: 14
Plan Fractions Treated to Date: 11
Plan Prescribed Dose Per Fraction: 2 Gy
Plan Total Fractions Prescribed: 40
Plan Total Prescribed Dose: 80 Gy
Reference Point Dosage Given to Date: 22 Gy
Reference Point Session Dosage Given: 2 Gy
Session Number: 11

## 2023-08-12 ENCOUNTER — Ambulatory Visit
Admission: RE | Admit: 2023-08-12 | Discharge: 2023-08-12 | Disposition: A | Discharge status: Home / Self Care | Payer: Medicare HMO | Source: Ambulatory Visit | Attending: Radiation Oncology | Admitting: Radiation Oncology

## 2023-08-12 ENCOUNTER — Other Ambulatory Visit: Payer: Self-pay

## 2023-08-12 DIAGNOSIS — Z510 Encounter for antineoplastic radiation therapy: Secondary | ICD-10-CM | POA: Diagnosis not present

## 2023-08-12 DIAGNOSIS — Z191 Hormone sensitive malignancy status: Secondary | ICD-10-CM | POA: Diagnosis not present

## 2023-08-12 DIAGNOSIS — C61 Malignant neoplasm of prostate: Secondary | ICD-10-CM | POA: Diagnosis not present

## 2023-08-12 LAB — RAD ONC ARIA SESSION SUMMARY
Course Elapsed Days: 15
Plan Fractions Treated to Date: 12
Plan Prescribed Dose Per Fraction: 2 Gy
Plan Total Fractions Prescribed: 40
Plan Total Prescribed Dose: 80 Gy
Reference Point Dosage Given to Date: 24 Gy
Reference Point Session Dosage Given: 2 Gy
Session Number: 12

## 2023-08-15 ENCOUNTER — Other Ambulatory Visit: Payer: Self-pay

## 2023-08-15 ENCOUNTER — Ambulatory Visit
Admission: RE | Admit: 2023-08-15 | Discharge: 2023-08-15 | Disposition: A | Discharge status: Home / Self Care | Payer: Medicare HMO | Source: Ambulatory Visit | Attending: Radiation Oncology | Admitting: Radiation Oncology

## 2023-08-15 DIAGNOSIS — Z191 Hormone sensitive malignancy status: Secondary | ICD-10-CM | POA: Diagnosis not present

## 2023-08-15 DIAGNOSIS — Z510 Encounter for antineoplastic radiation therapy: Secondary | ICD-10-CM | POA: Diagnosis not present

## 2023-08-15 DIAGNOSIS — C61 Malignant neoplasm of prostate: Secondary | ICD-10-CM | POA: Diagnosis not present

## 2023-08-15 LAB — RAD ONC ARIA SESSION SUMMARY
Course Elapsed Days: 18
Plan Fractions Treated to Date: 13
Plan Prescribed Dose Per Fraction: 2 Gy
Plan Total Fractions Prescribed: 40
Plan Total Prescribed Dose: 80 Gy
Reference Point Dosage Given to Date: 26 Gy
Reference Point Session Dosage Given: 2 Gy
Session Number: 13

## 2023-08-16 ENCOUNTER — Ambulatory Visit: Payer: Medicare HMO

## 2023-08-17 ENCOUNTER — Other Ambulatory Visit: Payer: Self-pay

## 2023-08-17 ENCOUNTER — Inpatient Hospital Stay: Payer: Medicare HMO

## 2023-08-17 ENCOUNTER — Ambulatory Visit
Admission: RE | Admit: 2023-08-17 | Discharge: 2023-08-17 | Disposition: A | Discharge status: Home / Self Care | Payer: Medicare HMO | Source: Ambulatory Visit | Attending: Radiation Oncology | Admitting: Radiation Oncology

## 2023-08-17 ENCOUNTER — Other Ambulatory Visit: Payer: Self-pay | Admitting: *Deleted

## 2023-08-17 DIAGNOSIS — C61 Malignant neoplasm of prostate: Secondary | ICD-10-CM

## 2023-08-17 DIAGNOSIS — N1330 Unspecified hydronephrosis: Secondary | ICD-10-CM | POA: Diagnosis not present

## 2023-08-17 DIAGNOSIS — Z191 Hormone sensitive malignancy status: Secondary | ICD-10-CM | POA: Diagnosis not present

## 2023-08-17 DIAGNOSIS — Z510 Encounter for antineoplastic radiation therapy: Secondary | ICD-10-CM | POA: Diagnosis not present

## 2023-08-17 LAB — RAD ONC ARIA SESSION SUMMARY
Course Elapsed Days: 20
Plan Fractions Treated to Date: 14
Plan Prescribed Dose Per Fraction: 2 Gy
Plan Total Fractions Prescribed: 40
Plan Total Prescribed Dose: 80 Gy
Reference Point Dosage Given to Date: 28 Gy
Reference Point Session Dosage Given: 2 Gy
Session Number: 14

## 2023-08-17 LAB — CBC (CANCER CENTER ONLY)
HCT: 36.6 % — ABNORMAL LOW (ref 39.0–52.0)
Hemoglobin: 12.4 g/dL — ABNORMAL LOW (ref 13.0–17.0)
MCH: 33.5 pg (ref 26.0–34.0)
MCHC: 33.9 g/dL (ref 30.0–36.0)
MCV: 98.9 fL (ref 80.0–100.0)
Platelet Count: 157 10*3/uL (ref 150–400)
RBC: 3.7 MIL/uL — ABNORMAL LOW (ref 4.22–5.81)
RDW: 13.2 % (ref 11.5–15.5)
WBC Count: 3.4 10*3/uL — ABNORMAL LOW (ref 4.0–10.5)
nRBC: 0 % (ref 0.0–0.2)

## 2023-08-17 MED ORDER — TAMSULOSIN HCL 0.4 MG PO CAPS: 0.4000 mg | ORAL_CAPSULE | Freq: Every day | ORAL | 0 refills | Status: AC

## 2023-08-18 ENCOUNTER — Ambulatory Visit
Admission: RE | Admit: 2023-08-18 | Discharge: 2023-08-18 | Disposition: A | Discharge status: Home / Self Care | Payer: Medicare HMO | Source: Ambulatory Visit | Attending: Radiation Oncology | Admitting: Radiation Oncology

## 2023-08-18 ENCOUNTER — Other Ambulatory Visit: Payer: Self-pay

## 2023-08-18 DIAGNOSIS — C61 Malignant neoplasm of prostate: Secondary | ICD-10-CM | POA: Diagnosis not present

## 2023-08-18 DIAGNOSIS — Z191 Hormone sensitive malignancy status: Secondary | ICD-10-CM | POA: Diagnosis not present

## 2023-08-18 DIAGNOSIS — Z510 Encounter for antineoplastic radiation therapy: Secondary | ICD-10-CM | POA: Diagnosis not present

## 2023-08-18 LAB — RAD ONC ARIA SESSION SUMMARY
Course Elapsed Days: 21
Plan Fractions Treated to Date: 15
Plan Prescribed Dose Per Fraction: 2 Gy
Plan Total Fractions Prescribed: 40
Plan Total Prescribed Dose: 80 Gy
Reference Point Dosage Given to Date: 30 Gy
Reference Point Session Dosage Given: 2 Gy
Session Number: 15

## 2023-08-19 ENCOUNTER — Ambulatory Visit: Admission: RE | Admit: 2023-08-19 | Payer: Medicare HMO | Source: Ambulatory Visit

## 2023-08-19 ENCOUNTER — Other Ambulatory Visit: Payer: Self-pay

## 2023-08-19 DIAGNOSIS — I700 Atherosclerosis of aorta: Secondary | ICD-10-CM | POA: Diagnosis not present

## 2023-08-19 DIAGNOSIS — C61 Malignant neoplasm of prostate: Secondary | ICD-10-CM | POA: Diagnosis not present

## 2023-08-19 DIAGNOSIS — R918 Other nonspecific abnormal finding of lung field: Secondary | ICD-10-CM | POA: Diagnosis not present

## 2023-08-19 DIAGNOSIS — I2510 Atherosclerotic heart disease of native coronary artery without angina pectoris: Secondary | ICD-10-CM | POA: Diagnosis not present

## 2023-08-19 DIAGNOSIS — C689 Malignant neoplasm of urinary organ, unspecified: Secondary | ICD-10-CM | POA: Diagnosis not present

## 2023-08-19 DIAGNOSIS — Z510 Encounter for antineoplastic radiation therapy: Secondary | ICD-10-CM | POA: Diagnosis not present

## 2023-08-19 DIAGNOSIS — Z191 Hormone sensitive malignancy status: Secondary | ICD-10-CM | POA: Diagnosis not present

## 2023-08-19 DIAGNOSIS — J439 Emphysema, unspecified: Secondary | ICD-10-CM | POA: Diagnosis not present

## 2023-08-19 LAB — RAD ONC ARIA SESSION SUMMARY
Course Elapsed Days: 22
Plan Fractions Treated to Date: 16
Plan Prescribed Dose Per Fraction: 2 Gy
Plan Total Fractions Prescribed: 40
Plan Total Prescribed Dose: 80 Gy
Reference Point Dosage Given to Date: 32 Gy
Reference Point Session Dosage Given: 2 Gy
Session Number: 16

## 2023-08-21 ENCOUNTER — Ambulatory Visit: Payer: Medicare HMO

## 2023-08-23 ENCOUNTER — Ambulatory Visit
Admission: RE | Admit: 2023-08-23 | Discharge: 2023-08-23 | Disposition: A | Discharge status: Home / Self Care | Payer: Medicare HMO | Source: Ambulatory Visit | Attending: Physician Assistant | Admitting: Physician Assistant

## 2023-08-23 ENCOUNTER — Ambulatory Visit
Admission: RE | Admit: 2023-08-23 | Discharge: 2023-08-23 | Disposition: A | Discharge status: Home / Self Care | Payer: Medicare HMO | Source: Ambulatory Visit | Attending: Radiation Oncology | Admitting: Radiation Oncology

## 2023-08-23 ENCOUNTER — Other Ambulatory Visit: Payer: Self-pay

## 2023-08-23 DIAGNOSIS — I2510 Atherosclerotic heart disease of native coronary artery without angina pectoris: Secondary | ICD-10-CM | POA: Diagnosis not present

## 2023-08-23 DIAGNOSIS — C61 Malignant neoplasm of prostate: Secondary | ICD-10-CM | POA: Diagnosis not present

## 2023-08-23 DIAGNOSIS — Z510 Encounter for antineoplastic radiation therapy: Secondary | ICD-10-CM | POA: Diagnosis not present

## 2023-08-23 DIAGNOSIS — C689 Malignant neoplasm of urinary organ, unspecified: Secondary | ICD-10-CM | POA: Diagnosis not present

## 2023-08-23 DIAGNOSIS — J439 Emphysema, unspecified: Secondary | ICD-10-CM | POA: Diagnosis not present

## 2023-08-23 DIAGNOSIS — R918 Other nonspecific abnormal finding of lung field: Secondary | ICD-10-CM | POA: Diagnosis not present

## 2023-08-23 DIAGNOSIS — I700 Atherosclerosis of aorta: Secondary | ICD-10-CM | POA: Insufficient documentation

## 2023-08-23 DIAGNOSIS — Z191 Hormone sensitive malignancy status: Secondary | ICD-10-CM | POA: Diagnosis not present

## 2023-08-23 LAB — RAD ONC ARIA SESSION SUMMARY
Course Elapsed Days: 26
Plan Fractions Treated to Date: 17
Plan Prescribed Dose Per Fraction: 2 Gy
Plan Total Fractions Prescribed: 40
Plan Total Prescribed Dose: 80 Gy
Reference Point Dosage Given to Date: 34 Gy
Reference Point Session Dosage Given: 2 Gy
Session Number: 17

## 2023-08-23 MED ORDER — IOHEXOL 300 MG/ML  SOLN
75.0000 mL | Freq: Once | INTRAMUSCULAR | Status: AC | PRN
  Administered 2023-08-23: 75 mL via INTRAVENOUS

## 2023-08-24 ENCOUNTER — Ambulatory Visit: Admission: RE | Admit: 2023-08-24 | Payer: Medicare HMO | Source: Ambulatory Visit

## 2023-08-24 ENCOUNTER — Other Ambulatory Visit: Payer: Self-pay

## 2023-08-24 DIAGNOSIS — R918 Other nonspecific abnormal finding of lung field: Secondary | ICD-10-CM | POA: Diagnosis not present

## 2023-08-24 DIAGNOSIS — C662 Malignant neoplasm of left ureter: Secondary | ICD-10-CM | POA: Diagnosis not present

## 2023-08-24 DIAGNOSIS — C61 Malignant neoplasm of prostate: Secondary | ICD-10-CM | POA: Diagnosis not present

## 2023-08-24 DIAGNOSIS — C661 Malignant neoplasm of right ureter: Secondary | ICD-10-CM | POA: Diagnosis not present

## 2023-08-24 DIAGNOSIS — Z191 Hormone sensitive malignancy status: Secondary | ICD-10-CM | POA: Diagnosis not present

## 2023-08-24 DIAGNOSIS — Z510 Encounter for antineoplastic radiation therapy: Secondary | ICD-10-CM | POA: Diagnosis not present

## 2023-08-24 LAB — RAD ONC ARIA SESSION SUMMARY
Course Elapsed Days: 27
Plan Fractions Treated to Date: 18
Plan Prescribed Dose Per Fraction: 2 Gy
Plan Total Fractions Prescribed: 40
Plan Total Prescribed Dose: 80 Gy
Reference Point Dosage Given to Date: 36 Gy
Reference Point Session Dosage Given: 2 Gy
Session Number: 18

## 2023-08-25 ENCOUNTER — Ambulatory Visit: Payer: Medicare HMO

## 2023-08-25 ENCOUNTER — Other Ambulatory Visit: Payer: Self-pay | Admitting: Urology

## 2023-08-25 DIAGNOSIS — R350 Frequency of micturition: Secondary | ICD-10-CM | POA: Diagnosis not present

## 2023-08-25 DIAGNOSIS — C669 Malignant neoplasm of unspecified ureter: Secondary | ICD-10-CM | POA: Diagnosis not present

## 2023-08-25 DIAGNOSIS — C689 Malignant neoplasm of urinary organ, unspecified: Secondary | ICD-10-CM | POA: Diagnosis not present

## 2023-08-25 DIAGNOSIS — R911 Solitary pulmonary nodule: Secondary | ICD-10-CM | POA: Diagnosis not present

## 2023-08-26 ENCOUNTER — Telehealth: Payer: Self-pay | Admitting: Urology

## 2023-08-26 ENCOUNTER — Other Ambulatory Visit: Payer: Self-pay

## 2023-08-26 ENCOUNTER — Ambulatory Visit
Admission: RE | Admit: 2023-08-26 | Discharge: 2023-08-26 | Disposition: A | Discharge status: Home / Self Care | Payer: Medicare HMO | Source: Ambulatory Visit | Attending: Radiation Oncology | Admitting: Radiation Oncology

## 2023-08-26 DIAGNOSIS — Z510 Encounter for antineoplastic radiation therapy: Secondary | ICD-10-CM | POA: Diagnosis not present

## 2023-08-26 DIAGNOSIS — C61 Malignant neoplasm of prostate: Secondary | ICD-10-CM | POA: Diagnosis not present

## 2023-08-26 DIAGNOSIS — Z191 Hormone sensitive malignancy status: Secondary | ICD-10-CM | POA: Diagnosis not present

## 2023-08-26 LAB — RAD ONC ARIA SESSION SUMMARY
Course Elapsed Days: 29
Plan Fractions Treated to Date: 19
Plan Prescribed Dose Per Fraction: 2 Gy
Plan Total Fractions Prescribed: 40
Plan Total Prescribed Dose: 80 Gy
Reference Point Dosage Given to Date: 38 Gy
Reference Point Session Dosage Given: 2 Gy
Session Number: 19

## 2023-08-26 NOTE — Telephone Encounter (Signed)
Sarah with [facility] called to ask if patient has received ADT Hormone Therapy with Dr. Lonna Cobb or Radiation Oncology through [facility]. He is a mutual patient. She said that patient cannot remember if he has. Please call her at [phone].

## 2023-08-26 NOTE — Telephone Encounter (Signed)
Talked with Maralyn Sago and advised her he had his Eligard injection on 07/13/2023

## 2023-08-29 ENCOUNTER — Other Ambulatory Visit: Payer: Self-pay

## 2023-08-29 ENCOUNTER — Ambulatory Visit
Admission: RE | Admit: 2023-08-29 | Discharge: 2023-08-29 | Disposition: A | Discharge status: Home / Self Care | Payer: Medicare HMO | Source: Ambulatory Visit | Attending: Radiation Oncology | Admitting: Radiation Oncology

## 2023-08-29 DIAGNOSIS — Z510 Encounter for antineoplastic radiation therapy: Secondary | ICD-10-CM | POA: Diagnosis not present

## 2023-08-29 DIAGNOSIS — C61 Malignant neoplasm of prostate: Secondary | ICD-10-CM | POA: Diagnosis not present

## 2023-08-29 DIAGNOSIS — Z191 Hormone sensitive malignancy status: Secondary | ICD-10-CM | POA: Diagnosis not present

## 2023-08-29 LAB — RAD ONC ARIA SESSION SUMMARY
Course Elapsed Days: 32
Plan Fractions Treated to Date: 20
Plan Prescribed Dose Per Fraction: 2 Gy
Plan Total Fractions Prescribed: 40
Plan Total Prescribed Dose: 80 Gy
Reference Point Dosage Given to Date: 40 Gy
Reference Point Session Dosage Given: 2 Gy
Session Number: 20

## 2023-08-30 ENCOUNTER — Ambulatory Visit
Admission: RE | Admit: 2023-08-30 | Discharge: 2023-08-30 | Disposition: A | Discharge status: Home / Self Care | Payer: Medicare HMO | Source: Ambulatory Visit | Attending: Radiation Oncology | Admitting: Radiation Oncology

## 2023-08-30 ENCOUNTER — Other Ambulatory Visit: Payer: Self-pay

## 2023-08-30 DIAGNOSIS — C61 Malignant neoplasm of prostate: Secondary | ICD-10-CM | POA: Diagnosis not present

## 2023-08-30 DIAGNOSIS — F419 Anxiety disorder, unspecified: Secondary | ICD-10-CM | POA: Diagnosis not present

## 2023-08-30 DIAGNOSIS — C689 Malignant neoplasm of urinary organ, unspecified: Secondary | ICD-10-CM | POA: Diagnosis not present

## 2023-08-30 DIAGNOSIS — Z510 Encounter for antineoplastic radiation therapy: Secondary | ICD-10-CM | POA: Diagnosis not present

## 2023-08-30 DIAGNOSIS — G2581 Restless legs syndrome: Secondary | ICD-10-CM | POA: Diagnosis not present

## 2023-08-30 DIAGNOSIS — Z191 Hormone sensitive malignancy status: Secondary | ICD-10-CM | POA: Diagnosis not present

## 2023-08-30 LAB — RAD ONC ARIA SESSION SUMMARY
Course Elapsed Days: 33
Plan Fractions Treated to Date: 21
Plan Prescribed Dose Per Fraction: 2 Gy
Plan Total Fractions Prescribed: 40
Plan Total Prescribed Dose: 80 Gy
Reference Point Dosage Given to Date: 42 Gy
Reference Point Session Dosage Given: 2 Gy
Session Number: 21

## 2023-08-31 ENCOUNTER — Other Ambulatory Visit: Payer: Self-pay

## 2023-08-31 ENCOUNTER — Inpatient Hospital Stay: Payer: Medicare HMO

## 2023-08-31 ENCOUNTER — Ambulatory Visit
Admission: RE | Admit: 2023-08-31 | Discharge: 2023-08-31 | Disposition: A | Discharge status: Home / Self Care | Payer: Medicare HMO | Source: Ambulatory Visit | Attending: Radiation Oncology | Admitting: Radiation Oncology

## 2023-08-31 DIAGNOSIS — Z191 Hormone sensitive malignancy status: Secondary | ICD-10-CM | POA: Diagnosis not present

## 2023-08-31 DIAGNOSIS — C61 Malignant neoplasm of prostate: Secondary | ICD-10-CM | POA: Insufficient documentation

## 2023-08-31 DIAGNOSIS — Z510 Encounter for antineoplastic radiation therapy: Secondary | ICD-10-CM | POA: Diagnosis not present

## 2023-08-31 LAB — RAD ONC ARIA SESSION SUMMARY
Course Elapsed Days: 34
Plan Fractions Treated to Date: 22
Plan Prescribed Dose Per Fraction: 2 Gy
Plan Total Fractions Prescribed: 40
Plan Total Prescribed Dose: 80 Gy
Reference Point Dosage Given to Date: 44 Gy
Reference Point Session Dosage Given: 2 Gy
Session Number: 22

## 2023-08-31 LAB — CBC (CANCER CENTER ONLY)
HCT: 38 % — ABNORMAL LOW (ref 39.0–52.0)
Hemoglobin: 12.6 g/dL — ABNORMAL LOW (ref 13.0–17.0)
MCH: 33.2 pg (ref 26.0–34.0)
MCHC: 33.2 g/dL (ref 30.0–36.0)
MCV: 100.3 fL — ABNORMAL HIGH (ref 80.0–100.0)
Platelet Count: 201 10*3/uL (ref 150–400)
RBC: 3.79 MIL/uL — ABNORMAL LOW (ref 4.22–5.81)
RDW: 13.4 % (ref 11.5–15.5)
WBC Count: 3.5 10*3/uL — ABNORMAL LOW (ref 4.0–10.5)
nRBC: 0 % (ref 0.0–0.2)

## 2023-09-01 ENCOUNTER — Other Ambulatory Visit: Payer: Self-pay

## 2023-09-01 ENCOUNTER — Ambulatory Visit
Admission: RE | Admit: 2023-09-01 | Discharge: 2023-09-01 | Disposition: A | Discharge status: Home / Self Care | Payer: Medicare HMO | Source: Ambulatory Visit | Attending: Radiation Oncology | Admitting: Radiation Oncology

## 2023-09-01 DIAGNOSIS — Z191 Hormone sensitive malignancy status: Secondary | ICD-10-CM | POA: Diagnosis not present

## 2023-09-01 DIAGNOSIS — C61 Malignant neoplasm of prostate: Secondary | ICD-10-CM | POA: Diagnosis not present

## 2023-09-01 DIAGNOSIS — Z510 Encounter for antineoplastic radiation therapy: Secondary | ICD-10-CM | POA: Diagnosis not present

## 2023-09-01 LAB — RAD ONC ARIA SESSION SUMMARY
Course Elapsed Days: 35
Plan Fractions Treated to Date: 23
Plan Prescribed Dose Per Fraction: 2 Gy
Plan Total Fractions Prescribed: 40
Plan Total Prescribed Dose: 80 Gy
Reference Point Dosage Given to Date: 46 Gy
Reference Point Session Dosage Given: 2 Gy
Session Number: 23

## 2023-09-02 ENCOUNTER — Ambulatory Visit
Admission: RE | Admit: 2023-09-02 | Discharge: 2023-09-02 | Disposition: A | Discharge status: Home / Self Care | Payer: Medicare HMO | Source: Ambulatory Visit | Attending: Radiation Oncology | Admitting: Radiation Oncology

## 2023-09-02 ENCOUNTER — Other Ambulatory Visit: Payer: Self-pay

## 2023-09-02 DIAGNOSIS — C61 Malignant neoplasm of prostate: Secondary | ICD-10-CM | POA: Diagnosis not present

## 2023-09-02 DIAGNOSIS — Z510 Encounter for antineoplastic radiation therapy: Secondary | ICD-10-CM | POA: Diagnosis not present

## 2023-09-02 DIAGNOSIS — Z191 Hormone sensitive malignancy status: Secondary | ICD-10-CM | POA: Diagnosis not present

## 2023-09-02 LAB — RAD ONC ARIA SESSION SUMMARY
Course Elapsed Days: 36
Plan Fractions Treated to Date: 24
Plan Prescribed Dose Per Fraction: 2 Gy
Plan Total Fractions Prescribed: 40
Plan Total Prescribed Dose: 80 Gy
Reference Point Dosage Given to Date: 48 Gy
Reference Point Session Dosage Given: 2 Gy
Session Number: 24

## 2023-09-05 ENCOUNTER — Encounter (INDEPENDENT_AMBULATORY_CARE_PROVIDER_SITE_OTHER): Payer: Medicare HMO

## 2023-09-05 ENCOUNTER — Other Ambulatory Visit: Payer: Self-pay

## 2023-09-05 ENCOUNTER — Ambulatory Visit
Admission: RE | Admit: 2023-09-05 | Discharge: 2023-09-05 | Disposition: A | Discharge status: Home / Self Care | Payer: Medicare HMO | Source: Ambulatory Visit | Attending: Radiation Oncology | Admitting: Radiation Oncology

## 2023-09-05 ENCOUNTER — Ambulatory Visit (INDEPENDENT_AMBULATORY_CARE_PROVIDER_SITE_OTHER): Payer: Medicare HMO | Admitting: Nurse Practitioner

## 2023-09-05 DIAGNOSIS — C61 Malignant neoplasm of prostate: Secondary | ICD-10-CM | POA: Diagnosis not present

## 2023-09-05 DIAGNOSIS — Z510 Encounter for antineoplastic radiation therapy: Secondary | ICD-10-CM | POA: Diagnosis not present

## 2023-09-05 DIAGNOSIS — Z191 Hormone sensitive malignancy status: Secondary | ICD-10-CM | POA: Diagnosis not present

## 2023-09-05 LAB — RAD ONC ARIA SESSION SUMMARY
Course Elapsed Days: 39
Plan Fractions Treated to Date: 25
Plan Prescribed Dose Per Fraction: 2 Gy
Plan Total Fractions Prescribed: 40
Plan Total Prescribed Dose: 80 Gy
Reference Point Dosage Given to Date: 50 Gy
Reference Point Session Dosage Given: 2 Gy
Session Number: 25

## 2023-09-06 ENCOUNTER — Other Ambulatory Visit: Payer: Self-pay

## 2023-09-06 ENCOUNTER — Ambulatory Visit
Admission: RE | Admit: 2023-09-06 | Discharge: 2023-09-06 | Disposition: A | Discharge status: Home / Self Care | Payer: Medicare HMO | Source: Ambulatory Visit | Attending: Radiation Oncology | Admitting: Radiation Oncology

## 2023-09-06 DIAGNOSIS — Z510 Encounter for antineoplastic radiation therapy: Secondary | ICD-10-CM | POA: Diagnosis not present

## 2023-09-06 DIAGNOSIS — Z191 Hormone sensitive malignancy status: Secondary | ICD-10-CM | POA: Diagnosis not present

## 2023-09-06 DIAGNOSIS — C61 Malignant neoplasm of prostate: Secondary | ICD-10-CM | POA: Diagnosis not present

## 2023-09-06 LAB — RAD ONC ARIA SESSION SUMMARY
Course Elapsed Days: 40
Plan Fractions Treated to Date: 26
Plan Prescribed Dose Per Fraction: 2 Gy
Plan Total Fractions Prescribed: 40
Plan Total Prescribed Dose: 80 Gy
Reference Point Dosage Given to Date: 52 Gy
Reference Point Session Dosage Given: 2 Gy
Session Number: 26

## 2023-09-07 ENCOUNTER — Ambulatory Visit
Admission: RE | Admit: 2023-09-07 | Discharge: 2023-09-07 | Disposition: A | Discharge status: Home / Self Care | Payer: Medicare HMO | Source: Ambulatory Visit | Attending: Radiation Oncology | Admitting: Radiation Oncology

## 2023-09-07 ENCOUNTER — Other Ambulatory Visit: Payer: Self-pay

## 2023-09-07 DIAGNOSIS — C61 Malignant neoplasm of prostate: Secondary | ICD-10-CM | POA: Diagnosis not present

## 2023-09-07 DIAGNOSIS — Z510 Encounter for antineoplastic radiation therapy: Secondary | ICD-10-CM | POA: Diagnosis not present

## 2023-09-07 DIAGNOSIS — Z191 Hormone sensitive malignancy status: Secondary | ICD-10-CM | POA: Diagnosis not present

## 2023-09-07 LAB — RAD ONC ARIA SESSION SUMMARY
Course Elapsed Days: 41
Plan Fractions Treated to Date: 27
Plan Prescribed Dose Per Fraction: 2 Gy
Plan Total Fractions Prescribed: 40
Plan Total Prescribed Dose: 80 Gy
Reference Point Dosage Given to Date: 54 Gy
Reference Point Session Dosage Given: 2 Gy
Session Number: 27

## 2023-09-08 ENCOUNTER — Other Ambulatory Visit: Payer: Self-pay

## 2023-09-08 ENCOUNTER — Ambulatory Visit
Admission: RE | Admit: 2023-09-08 | Discharge: 2023-09-08 | Disposition: A | Discharge status: Home / Self Care | Payer: Medicare HMO | Source: Ambulatory Visit | Attending: Radiation Oncology | Admitting: Radiation Oncology

## 2023-09-08 DIAGNOSIS — Z191 Hormone sensitive malignancy status: Secondary | ICD-10-CM | POA: Diagnosis not present

## 2023-09-08 DIAGNOSIS — C61 Malignant neoplasm of prostate: Secondary | ICD-10-CM | POA: Diagnosis not present

## 2023-09-08 DIAGNOSIS — Z510 Encounter for antineoplastic radiation therapy: Secondary | ICD-10-CM | POA: Diagnosis not present

## 2023-09-08 LAB — RAD ONC ARIA SESSION SUMMARY
Course Elapsed Days: 42
Plan Fractions Treated to Date: 28
Plan Prescribed Dose Per Fraction: 2 Gy
Plan Total Fractions Prescribed: 40
Plan Total Prescribed Dose: 80 Gy
Reference Point Dosage Given to Date: 56 Gy
Reference Point Session Dosage Given: 2 Gy
Session Number: 28

## 2023-09-09 ENCOUNTER — Ambulatory Visit
Admission: RE | Admit: 2023-09-09 | Discharge: 2023-09-09 | Disposition: A | Discharge status: Home / Self Care | Payer: Medicare HMO | Source: Ambulatory Visit | Attending: Radiation Oncology | Admitting: Radiation Oncology

## 2023-09-09 ENCOUNTER — Other Ambulatory Visit: Payer: Self-pay

## 2023-09-09 DIAGNOSIS — Z191 Hormone sensitive malignancy status: Secondary | ICD-10-CM | POA: Diagnosis not present

## 2023-09-09 DIAGNOSIS — C61 Malignant neoplasm of prostate: Secondary | ICD-10-CM | POA: Diagnosis not present

## 2023-09-09 DIAGNOSIS — Z510 Encounter for antineoplastic radiation therapy: Secondary | ICD-10-CM | POA: Diagnosis not present

## 2023-09-09 LAB — RAD ONC ARIA SESSION SUMMARY
Course Elapsed Days: 43
Plan Fractions Treated to Date: 29
Plan Prescribed Dose Per Fraction: 2 Gy
Plan Total Fractions Prescribed: 40
Plan Total Prescribed Dose: 80 Gy
Reference Point Dosage Given to Date: 58 Gy
Reference Point Session Dosage Given: 2 Gy
Session Number: 29

## 2023-09-12 ENCOUNTER — Ambulatory Visit
Admission: RE | Admit: 2023-09-12 | Discharge: 2023-09-12 | Disposition: A | Discharge status: Home / Self Care | Payer: Medicare HMO | Source: Ambulatory Visit | Attending: Radiation Oncology | Admitting: Radiation Oncology

## 2023-09-12 ENCOUNTER — Other Ambulatory Visit: Payer: Self-pay

## 2023-09-12 DIAGNOSIS — Z191 Hormone sensitive malignancy status: Secondary | ICD-10-CM | POA: Diagnosis not present

## 2023-09-12 DIAGNOSIS — Z510 Encounter for antineoplastic radiation therapy: Secondary | ICD-10-CM | POA: Diagnosis not present

## 2023-09-12 DIAGNOSIS — C61 Malignant neoplasm of prostate: Secondary | ICD-10-CM | POA: Diagnosis not present

## 2023-09-12 LAB — RAD ONC ARIA SESSION SUMMARY
Course Elapsed Days: 46
Plan Fractions Treated to Date: 30
Plan Prescribed Dose Per Fraction: 2 Gy
Plan Total Fractions Prescribed: 40
Plan Total Prescribed Dose: 80 Gy
Reference Point Dosage Given to Date: 60 Gy
Reference Point Session Dosage Given: 2 Gy
Session Number: 30

## 2023-09-13 ENCOUNTER — Other Ambulatory Visit: Payer: Self-pay

## 2023-09-13 ENCOUNTER — Ambulatory Visit
Admission: RE | Admit: 2023-09-13 | Discharge: 2023-09-13 | Disposition: A | Discharge status: Home / Self Care | Payer: Medicare HMO | Source: Ambulatory Visit | Attending: Radiation Oncology | Admitting: Radiation Oncology

## 2023-09-13 DIAGNOSIS — C61 Malignant neoplasm of prostate: Secondary | ICD-10-CM | POA: Diagnosis not present

## 2023-09-13 DIAGNOSIS — Z510 Encounter for antineoplastic radiation therapy: Secondary | ICD-10-CM | POA: Diagnosis not present

## 2023-09-13 DIAGNOSIS — Z191 Hormone sensitive malignancy status: Secondary | ICD-10-CM | POA: Diagnosis not present

## 2023-09-13 LAB — RAD ONC ARIA SESSION SUMMARY
Course Elapsed Days: 47
Plan Fractions Treated to Date: 31
Plan Prescribed Dose Per Fraction: 2 Gy
Plan Total Fractions Prescribed: 40
Plan Total Prescribed Dose: 80 Gy
Reference Point Dosage Given to Date: 62 Gy
Reference Point Session Dosage Given: 2 Gy
Session Number: 31

## 2023-09-14 ENCOUNTER — Inpatient Hospital Stay: Payer: Medicare HMO

## 2023-09-14 ENCOUNTER — Other Ambulatory Visit: Payer: Self-pay

## 2023-09-14 ENCOUNTER — Ambulatory Visit
Admission: RE | Admit: 2023-09-14 | Discharge: 2023-09-14 | Disposition: A | Discharge status: Home / Self Care | Payer: Medicare HMO | Source: Ambulatory Visit | Attending: Radiation Oncology | Admitting: Radiation Oncology

## 2023-09-14 DIAGNOSIS — Z191 Hormone sensitive malignancy status: Secondary | ICD-10-CM | POA: Diagnosis not present

## 2023-09-14 DIAGNOSIS — C61 Malignant neoplasm of prostate: Secondary | ICD-10-CM

## 2023-09-14 DIAGNOSIS — Z510 Encounter for antineoplastic radiation therapy: Secondary | ICD-10-CM | POA: Diagnosis not present

## 2023-09-14 LAB — RAD ONC ARIA SESSION SUMMARY
Course Elapsed Days: 48
Plan Fractions Treated to Date: 32
Plan Prescribed Dose Per Fraction: 2 Gy
Plan Total Fractions Prescribed: 40
Plan Total Prescribed Dose: 80 Gy
Reference Point Dosage Given to Date: 64 Gy
Reference Point Session Dosage Given: 2 Gy
Session Number: 32

## 2023-09-14 LAB — CBC (CANCER CENTER ONLY)
HCT: 34.6 % — ABNORMAL LOW (ref 39.0–52.0)
Hemoglobin: 11.5 g/dL — ABNORMAL LOW (ref 13.0–17.0)
MCH: 33 pg (ref 26.0–34.0)
MCHC: 33.2 g/dL (ref 30.0–36.0)
MCV: 99.1 fL (ref 80.0–100.0)
Platelet Count: 185 10*3/uL (ref 150–400)
RBC: 3.49 MIL/uL — ABNORMAL LOW (ref 4.22–5.81)
RDW: 13.3 % (ref 11.5–15.5)
WBC Count: 3.5 10*3/uL — ABNORMAL LOW (ref 4.0–10.5)
nRBC: 0 % (ref 0.0–0.2)

## 2023-09-15 ENCOUNTER — Ambulatory Visit
Admission: RE | Admit: 2023-09-15 | Discharge: 2023-09-15 | Disposition: A | Discharge status: Home / Self Care | Payer: Medicare HMO | Source: Ambulatory Visit | Attending: Radiation Oncology | Admitting: Radiation Oncology

## 2023-09-15 ENCOUNTER — Other Ambulatory Visit: Payer: Self-pay

## 2023-09-15 DIAGNOSIS — Z510 Encounter for antineoplastic radiation therapy: Secondary | ICD-10-CM | POA: Diagnosis not present

## 2023-09-15 DIAGNOSIS — C61 Malignant neoplasm of prostate: Secondary | ICD-10-CM | POA: Diagnosis not present

## 2023-09-15 DIAGNOSIS — Z191 Hormone sensitive malignancy status: Secondary | ICD-10-CM | POA: Diagnosis not present

## 2023-09-15 LAB — RAD ONC ARIA SESSION SUMMARY
Course Elapsed Days: 49
Plan Fractions Treated to Date: 33
Plan Prescribed Dose Per Fraction: 2 Gy
Plan Total Fractions Prescribed: 40
Plan Total Prescribed Dose: 80 Gy
Reference Point Dosage Given to Date: 66 Gy
Reference Point Session Dosage Given: 2 Gy
Session Number: 33

## 2023-09-16 ENCOUNTER — Other Ambulatory Visit: Payer: Self-pay

## 2023-09-16 ENCOUNTER — Ambulatory Visit
Admission: RE | Admit: 2023-09-16 | Discharge: 2023-09-16 | Disposition: A | Discharge status: Home / Self Care | Payer: Medicare HMO | Source: Ambulatory Visit | Attending: Radiation Oncology | Admitting: Radiation Oncology

## 2023-09-16 DIAGNOSIS — C61 Malignant neoplasm of prostate: Secondary | ICD-10-CM | POA: Diagnosis not present

## 2023-09-16 DIAGNOSIS — Z191 Hormone sensitive malignancy status: Secondary | ICD-10-CM | POA: Diagnosis not present

## 2023-09-16 DIAGNOSIS — Z510 Encounter for antineoplastic radiation therapy: Secondary | ICD-10-CM | POA: Diagnosis not present

## 2023-09-16 LAB — RAD ONC ARIA SESSION SUMMARY
Course Elapsed Days: 50
Plan Fractions Treated to Date: 34
Plan Prescribed Dose Per Fraction: 2 Gy
Plan Total Fractions Prescribed: 40
Plan Total Prescribed Dose: 80 Gy
Reference Point Dosage Given to Date: 68 Gy
Reference Point Session Dosage Given: 2 Gy
Session Number: 34

## 2023-09-19 ENCOUNTER — Other Ambulatory Visit: Payer: Self-pay

## 2023-09-19 ENCOUNTER — Ambulatory Visit
Admission: RE | Admit: 2023-09-19 | Discharge: 2023-09-19 | Disposition: A | Discharge status: Home / Self Care | Payer: Medicare HMO | Source: Ambulatory Visit | Attending: Radiation Oncology | Admitting: Radiation Oncology

## 2023-09-19 DIAGNOSIS — Z510 Encounter for antineoplastic radiation therapy: Secondary | ICD-10-CM | POA: Diagnosis not present

## 2023-09-19 DIAGNOSIS — Z191 Hormone sensitive malignancy status: Secondary | ICD-10-CM | POA: Diagnosis not present

## 2023-09-19 DIAGNOSIS — C61 Malignant neoplasm of prostate: Secondary | ICD-10-CM | POA: Diagnosis not present

## 2023-09-19 LAB — RAD ONC ARIA SESSION SUMMARY
Course Elapsed Days: 53
Plan Fractions Treated to Date: 35
Plan Prescribed Dose Per Fraction: 2 Gy
Plan Total Fractions Prescribed: 40
Plan Total Prescribed Dose: 80 Gy
Reference Point Dosage Given to Date: 70 Gy
Reference Point Session Dosage Given: 2 Gy
Session Number: 35

## 2023-09-20 ENCOUNTER — Other Ambulatory Visit: Payer: Self-pay

## 2023-09-20 ENCOUNTER — Ambulatory Visit
Admission: RE | Admit: 2023-09-20 | Discharge: 2023-09-20 | Disposition: A | Discharge status: Home / Self Care | Payer: Medicare HMO | Source: Ambulatory Visit | Attending: Radiation Oncology | Admitting: Radiation Oncology

## 2023-09-20 DIAGNOSIS — Z191 Hormone sensitive malignancy status: Secondary | ICD-10-CM | POA: Diagnosis not present

## 2023-09-20 DIAGNOSIS — C61 Malignant neoplasm of prostate: Secondary | ICD-10-CM | POA: Diagnosis not present

## 2023-09-20 DIAGNOSIS — Z510 Encounter for antineoplastic radiation therapy: Secondary | ICD-10-CM | POA: Diagnosis not present

## 2023-09-20 LAB — RAD ONC ARIA SESSION SUMMARY
Course Elapsed Days: 54
Plan Fractions Treated to Date: 36
Plan Prescribed Dose Per Fraction: 2 Gy
Plan Total Fractions Prescribed: 40
Plan Total Prescribed Dose: 80 Gy
Reference Point Dosage Given to Date: 72 Gy
Reference Point Session Dosage Given: 2 Gy
Session Number: 36

## 2023-09-21 ENCOUNTER — Other Ambulatory Visit: Payer: Self-pay | Admitting: *Deleted

## 2023-09-21 ENCOUNTER — Ambulatory Visit
Admission: RE | Admit: 2023-09-21 | Discharge: 2023-09-21 | Disposition: A | Discharge status: Home / Self Care | Payer: Medicare HMO | Source: Ambulatory Visit | Attending: Radiation Oncology | Admitting: Radiation Oncology

## 2023-09-21 ENCOUNTER — Inpatient Hospital Stay: Payer: Medicare HMO

## 2023-09-21 ENCOUNTER — Other Ambulatory Visit: Payer: Self-pay

## 2023-09-21 DIAGNOSIS — Z510 Encounter for antineoplastic radiation therapy: Secondary | ICD-10-CM | POA: Diagnosis not present

## 2023-09-21 DIAGNOSIS — C61 Malignant neoplasm of prostate: Secondary | ICD-10-CM

## 2023-09-21 DIAGNOSIS — Z191 Hormone sensitive malignancy status: Secondary | ICD-10-CM | POA: Diagnosis not present

## 2023-09-21 LAB — URINALYSIS, COMPLETE (UACMP) WITH MICROSCOPIC
Bilirubin Urine: NEGATIVE
Glucose, UA: NEGATIVE mg/dL
Ketones, ur: NEGATIVE mg/dL
Nitrite: NEGATIVE
Protein, ur: 100 mg/dL — AB
RBC / HPF: >50 RBC/hpf (ref 0–5)
Specific Gravity, Urine: 1.014 (ref 1.005–1.030)
Squamous Epithelial / HPF: 0 /[HPF] (ref 0–5)
pH: 5 (ref 5.0–8.0)

## 2023-09-21 LAB — RAD ONC ARIA SESSION SUMMARY
Course Elapsed Days: 55
Plan Fractions Treated to Date: 37
Plan Prescribed Dose Per Fraction: 2 Gy
Plan Total Fractions Prescribed: 40
Plan Total Prescribed Dose: 80 Gy
Reference Point Dosage Given to Date: 74 Gy
Reference Point Session Dosage Given: 2 Gy
Session Number: 37

## 2023-09-22 ENCOUNTER — Ambulatory Visit
Admission: RE | Admit: 2023-09-22 | Discharge: 2023-09-22 | Disposition: A | Discharge status: Home / Self Care | Payer: Medicare HMO | Source: Ambulatory Visit | Attending: Radiation Oncology | Admitting: Radiation Oncology

## 2023-09-22 ENCOUNTER — Other Ambulatory Visit: Payer: Self-pay

## 2023-09-22 ENCOUNTER — Ambulatory Visit: Payer: Medicare HMO

## 2023-09-22 DIAGNOSIS — Z510 Encounter for antineoplastic radiation therapy: Secondary | ICD-10-CM | POA: Diagnosis not present

## 2023-09-22 DIAGNOSIS — Z191 Hormone sensitive malignancy status: Secondary | ICD-10-CM | POA: Diagnosis not present

## 2023-09-22 DIAGNOSIS — R972 Elevated prostate specific antigen [PSA]: Secondary | ICD-10-CM | POA: Diagnosis not present

## 2023-09-22 DIAGNOSIS — I2510 Atherosclerotic heart disease of native coronary artery without angina pectoris: Secondary | ICD-10-CM | POA: Diagnosis not present

## 2023-09-22 DIAGNOSIS — I6523 Occlusion and stenosis of bilateral carotid arteries: Secondary | ICD-10-CM | POA: Diagnosis not present

## 2023-09-22 DIAGNOSIS — Z125 Encounter for screening for malignant neoplasm of prostate: Secondary | ICD-10-CM | POA: Diagnosis not present

## 2023-09-22 DIAGNOSIS — E119 Type 2 diabetes mellitus without complications: Secondary | ICD-10-CM | POA: Diagnosis not present

## 2023-09-22 DIAGNOSIS — I10 Essential (primary) hypertension: Secondary | ICD-10-CM | POA: Diagnosis not present

## 2023-09-22 DIAGNOSIS — J439 Emphysema, unspecified: Secondary | ICD-10-CM | POA: Diagnosis not present

## 2023-09-22 DIAGNOSIS — C61 Malignant neoplasm of prostate: Secondary | ICD-10-CM | POA: Diagnosis not present

## 2023-09-22 DIAGNOSIS — E785 Hyperlipidemia, unspecified: Secondary | ICD-10-CM | POA: Diagnosis not present

## 2023-09-22 LAB — RAD ONC ARIA SESSION SUMMARY
Course Elapsed Days: 56
Plan Fractions Treated to Date: 38
Plan Prescribed Dose Per Fraction: 2 Gy
Plan Total Fractions Prescribed: 40
Plan Total Prescribed Dose: 80 Gy
Reference Point Dosage Given to Date: 76 Gy
Reference Point Session Dosage Given: 2 Gy
Session Number: 38

## 2023-09-23 ENCOUNTER — Ambulatory Visit: Payer: Medicare HMO

## 2023-09-23 ENCOUNTER — Ambulatory Visit
Admission: RE | Admit: 2023-09-23 | Discharge: 2023-09-23 | Disposition: A | Discharge status: Home / Self Care | Payer: Medicare HMO | Source: Ambulatory Visit | Attending: Radiation Oncology | Admitting: Radiation Oncology

## 2023-09-23 ENCOUNTER — Other Ambulatory Visit: Payer: Self-pay

## 2023-09-23 DIAGNOSIS — Z510 Encounter for antineoplastic radiation therapy: Secondary | ICD-10-CM | POA: Diagnosis not present

## 2023-09-23 DIAGNOSIS — Z191 Hormone sensitive malignancy status: Secondary | ICD-10-CM | POA: Diagnosis not present

## 2023-09-23 DIAGNOSIS — C61 Malignant neoplasm of prostate: Secondary | ICD-10-CM | POA: Diagnosis not present

## 2023-09-23 LAB — RAD ONC ARIA SESSION SUMMARY
Course Elapsed Days: 57
Plan Fractions Treated to Date: 39
Plan Prescribed Dose Per Fraction: 2 Gy
Plan Total Fractions Prescribed: 40
Plan Total Prescribed Dose: 80 Gy
Reference Point Dosage Given to Date: 78 Gy
Reference Point Session Dosage Given: 2 Gy
Session Number: 39

## 2023-09-26 ENCOUNTER — Ambulatory Visit
Admission: RE | Admit: 2023-09-26 | Discharge: 2023-09-26 | Disposition: A | Discharge status: Home / Self Care | Payer: Medicare HMO | Source: Ambulatory Visit | Attending: Radiation Oncology | Admitting: Radiation Oncology

## 2023-09-26 ENCOUNTER — Other Ambulatory Visit: Payer: Self-pay

## 2023-09-26 DIAGNOSIS — Z191 Hormone sensitive malignancy status: Secondary | ICD-10-CM | POA: Diagnosis not present

## 2023-09-26 DIAGNOSIS — C61 Malignant neoplasm of prostate: Secondary | ICD-10-CM | POA: Diagnosis not present

## 2023-09-26 DIAGNOSIS — Z510 Encounter for antineoplastic radiation therapy: Secondary | ICD-10-CM | POA: Diagnosis not present

## 2023-09-26 LAB — RAD ONC ARIA SESSION SUMMARY
Course Elapsed Days: 60
Plan Fractions Treated to Date: 40
Plan Prescribed Dose Per Fraction: 2 Gy
Plan Total Fractions Prescribed: 40
Plan Total Prescribed Dose: 80 Gy
Reference Point Dosage Given to Date: 80 Gy
Reference Point Session Dosage Given: 2 Gy
Session Number: 40

## 2023-09-29 DIAGNOSIS — D649 Anemia, unspecified: Secondary | ICD-10-CM | POA: Diagnosis not present

## 2023-09-29 DIAGNOSIS — E119 Type 2 diabetes mellitus without complications: Secondary | ICD-10-CM | POA: Diagnosis not present

## 2023-09-29 DIAGNOSIS — C659 Malignant neoplasm of unspecified renal pelvis: Secondary | ICD-10-CM | POA: Diagnosis not present

## 2023-09-29 DIAGNOSIS — C669 Malignant neoplasm of unspecified ureter: Secondary | ICD-10-CM | POA: Diagnosis not present

## 2023-09-29 DIAGNOSIS — E785 Hyperlipidemia, unspecified: Secondary | ICD-10-CM | POA: Diagnosis not present

## 2023-09-29 DIAGNOSIS — I10 Essential (primary) hypertension: Secondary | ICD-10-CM | POA: Diagnosis not present

## 2023-09-29 DIAGNOSIS — C61 Malignant neoplasm of prostate: Secondary | ICD-10-CM | POA: Diagnosis not present

## 2023-10-11 DIAGNOSIS — C689 Malignant neoplasm of urinary organ, unspecified: Secondary | ICD-10-CM | POA: Diagnosis not present

## 2023-10-11 DIAGNOSIS — R8290 Unspecified abnormal findings in urine: Secondary | ICD-10-CM | POA: Diagnosis not present

## 2023-10-21 DIAGNOSIS — E785 Hyperlipidemia, unspecified: Secondary | ICD-10-CM | POA: Diagnosis not present

## 2023-10-21 DIAGNOSIS — C661 Malignant neoplasm of right ureter: Secondary | ICD-10-CM | POA: Diagnosis not present

## 2023-10-21 DIAGNOSIS — J449 Chronic obstructive pulmonary disease, unspecified: Secondary | ICD-10-CM | POA: Diagnosis not present

## 2023-10-21 DIAGNOSIS — N1330 Unspecified hydronephrosis: Secondary | ICD-10-CM | POA: Diagnosis not present

## 2023-10-21 DIAGNOSIS — I110 Hypertensive heart disease with heart failure: Secondary | ICD-10-CM | POA: Diagnosis not present

## 2023-10-21 DIAGNOSIS — Z466 Encounter for fitting and adjustment of urinary device: Secondary | ICD-10-CM | POA: Diagnosis not present

## 2023-10-21 DIAGNOSIS — I509 Heart failure, unspecified: Secondary | ICD-10-CM | POA: Diagnosis not present

## 2023-10-21 DIAGNOSIS — I2510 Atherosclerotic heart disease of native coronary artery without angina pectoris: Secondary | ICD-10-CM | POA: Diagnosis not present

## 2023-10-21 DIAGNOSIS — N2882 Megaloureter: Secondary | ICD-10-CM | POA: Diagnosis not present

## 2023-10-21 DIAGNOSIS — C689 Malignant neoplasm of urinary organ, unspecified: Secondary | ICD-10-CM | POA: Diagnosis not present

## 2023-10-31 ENCOUNTER — Ambulatory Visit: Payer: Medicare HMO | Attending: Radiation Oncology | Admitting: Radiation Oncology

## 2023-10-31 DIAGNOSIS — C61 Malignant neoplasm of prostate: Secondary | ICD-10-CM | POA: Insufficient documentation

## 2023-11-08 DIAGNOSIS — D4959 Neoplasm of unspecified behavior of other genitourinary organ: Secondary | ICD-10-CM | POA: Diagnosis not present

## 2023-11-08 DIAGNOSIS — I2510 Atherosclerotic heart disease of native coronary artery without angina pectoris: Secondary | ICD-10-CM | POA: Diagnosis not present

## 2023-11-08 DIAGNOSIS — I252 Old myocardial infarction: Secondary | ICD-10-CM | POA: Diagnosis not present

## 2023-11-08 DIAGNOSIS — N189 Chronic kidney disease, unspecified: Secondary | ICD-10-CM | POA: Diagnosis not present

## 2023-11-08 DIAGNOSIS — C679 Malignant neoplasm of bladder, unspecified: Secondary | ICD-10-CM | POA: Diagnosis not present

## 2023-11-08 DIAGNOSIS — I130 Hypertensive heart and chronic kidney disease with heart failure and stage 1 through stage 4 chronic kidney disease, or unspecified chronic kidney disease: Secondary | ICD-10-CM | POA: Diagnosis not present

## 2023-11-08 DIAGNOSIS — C689 Malignant neoplasm of urinary organ, unspecified: Secondary | ICD-10-CM | POA: Diagnosis not present

## 2023-11-08 DIAGNOSIS — R9431 Abnormal electrocardiogram [ECG] [EKG]: Secondary | ICD-10-CM | POA: Diagnosis not present

## 2023-11-08 DIAGNOSIS — N135 Crossing vessel and stricture of ureter without hydronephrosis: Secondary | ICD-10-CM | POA: Diagnosis not present

## 2023-11-08 DIAGNOSIS — F17200 Nicotine dependence, unspecified, uncomplicated: Secondary | ICD-10-CM | POA: Diagnosis not present

## 2023-11-08 DIAGNOSIS — I509 Heart failure, unspecified: Secondary | ICD-10-CM | POA: Diagnosis not present

## 2023-11-09 DIAGNOSIS — C689 Malignant neoplasm of urinary organ, unspecified: Secondary | ICD-10-CM | POA: Diagnosis not present

## 2023-11-24 DIAGNOSIS — Z03818 Encounter for observation for suspected exposure to other biological agents ruled out: Secondary | ICD-10-CM | POA: Diagnosis not present

## 2023-11-24 DIAGNOSIS — J069 Acute upper respiratory infection, unspecified: Secondary | ICD-10-CM | POA: Diagnosis not present

## 2023-11-25 DIAGNOSIS — C61 Malignant neoplasm of prostate: Secondary | ICD-10-CM | POA: Diagnosis not present

## 2023-11-25 DIAGNOSIS — R21 Rash and other nonspecific skin eruption: Secondary | ICD-10-CM | POA: Diagnosis not present

## 2023-11-25 DIAGNOSIS — R700 Elevated erythrocyte sedimentation rate: Secondary | ICD-10-CM | POA: Diagnosis not present

## 2023-11-25 DIAGNOSIS — C680 Malignant neoplasm of urethra: Secondary | ICD-10-CM | POA: Diagnosis not present

## 2023-11-25 DIAGNOSIS — E119 Type 2 diabetes mellitus without complications: Secondary | ICD-10-CM | POA: Diagnosis not present

## 2023-12-09 ENCOUNTER — Ambulatory Visit: Payer: Medicare HMO | Attending: Cardiology | Admitting: Cardiology

## 2023-12-09 ENCOUNTER — Encounter: Payer: Self-pay | Admitting: Cardiology

## 2023-12-09 VITALS — BP 132/64 | HR 70 | Ht 70.0 in | Wt 176.8 lb

## 2023-12-09 DIAGNOSIS — E7800 Pure hypercholesterolemia, unspecified: Secondary | ICD-10-CM | POA: Diagnosis not present

## 2023-12-09 DIAGNOSIS — I2089 Other forms of angina pectoris: Secondary | ICD-10-CM

## 2023-12-09 DIAGNOSIS — J069 Acute upper respiratory infection, unspecified: Secondary | ICD-10-CM | POA: Diagnosis not present

## 2023-12-09 DIAGNOSIS — I10 Essential (primary) hypertension: Secondary | ICD-10-CM | POA: Diagnosis not present

## 2023-12-09 DIAGNOSIS — D649 Anemia, unspecified: Secondary | ICD-10-CM | POA: Diagnosis not present

## 2023-12-09 DIAGNOSIS — I2510 Atherosclerotic heart disease of native coronary artery without angina pectoris: Secondary | ICD-10-CM | POA: Diagnosis not present

## 2023-12-09 DIAGNOSIS — R700 Elevated erythrocyte sedimentation rate: Secondary | ICD-10-CM | POA: Diagnosis not present

## 2023-12-09 DIAGNOSIS — F17200 Nicotine dependence, unspecified, uncomplicated: Secondary | ICD-10-CM

## 2023-12-09 DIAGNOSIS — Z01810 Encounter for preprocedural cardiovascular examination: Secondary | ICD-10-CM

## 2023-12-09 DIAGNOSIS — R21 Rash and other nonspecific skin eruption: Secondary | ICD-10-CM | POA: Diagnosis not present

## 2023-12-09 MED ORDER — OMEPRAZOLE 20 MG PO CPDR: 20.0000 mg | DELAYED_RELEASE_CAPSULE | Freq: Every day | ORAL | 11 refills | Status: DC

## 2023-12-09 NOTE — Progress Notes (Unsigned)
Cardiology Office Note:    Date:  12/09/2023   ID:  Seth Rivera, DOB [birth year], MRN [id number]  PCP:  Mick Sell, MD   [facility] Providers Cardiologist:  None     Referring MD: Mychart, Generic Provid*   Chief Complaint  Patient presents with   New Patient (Initial Visit)    Referred to establish cardiac care previously seen by [facility] last seen 2019.  Returning today as advised by [facility] with recent treatments of prostate and now kidney cancer.  Patient is concerned with new LE edema    History of Present Illness:    Seth Rivera is a 77 y.o. male with a hx of NSTEMI s/p PTCA to prox RCA in 1993, hypertension, hyperlipidemia, PAD s/p right carotid endarterectomy, smoker x 60+ years, COPD who presents to establish care.  Previously seen by [facility] cardiology from a cardiac perspective.  He was diagnosed and being treated for prostate cancer over the past 3 months.  Recently also diagnosed with renal cancer.  Resection being planned.  Preprocedural cardiac eval recommended.  He denies chest pain but endorses shortness of breath.  Had blood work today, including lipid panel.  Cholesterol he states is adequately controlled.  Chest CT 08/2023 LAD, RCA, left circumflex coronary calcifications.  Stress echo 2018 LVEF over 55%.  No evidence of scar.  Past Medical History:  Diagnosis Date   Allergic rhinitis    Bilateral carotid artery stenosis    Bladder-neck obstruction    Carotid stenosis    Colon polyp    COPD (chronic obstructive pulmonary disease) (HCC)    Coronary artery disease    Coronary atherosclerosis of native coronary artery    Diabetes mellitus without complication (HCC)    NO medications (patient state borderline diabetes)   Elevated PSA    Gastric ulcer    History of non-ST elevation myocardial infarction (NSTEMI)    Hyperlipidemia    Hypertension    Ischemic finger    Malignant neoplasm of prostate (HCC)    stage ll C    Myocardial infarction [facility])    Personal history of tobacco use, presenting hazards to health 05/20/2016   Pulmonary granuloma (HCC)    Pulmonary granuloma (HCC)    Pure hypercholesterolemia    Restless legs syndrome (RLS)    Squamous cell carcinoma in situ    Thoracic outlet syndrome    Tobacco use disorder    Tubular adenoma of colon    Tubular adenoma of colon    Type 2 diabetes mellitus without complication (HCC)     Past Surgical History:  Procedure Laterality Date   APPENDECTOMY  1949   CAROTID ENDARTERECTOMY     CATARACT EXTRACTION  2014   COLONOSCOPY  2014   COLONOSCOPY WITH PROPOFOL N/A 09/06/2016   Procedure: COLONOSCOPY WITH PROPOFOL;  Surgeon: Scot Jun, MD;  Location: [facility] ENDOSCOPY;  Service: Endoscopy;  Laterality: N/A;   COLONOSCOPY WITH PROPOFOL N/A 02/28/2020   Procedure: COLONOSCOPY WITH PROPOFOL;  Surgeon: Toledo, Boykin Nearing, MD;  Location: [facility] ENDOSCOPY;  Service: Gastroenterology;  Laterality: N/A;   CORONARY ANGIOPLASTY  1999   CYSTOSCOPY W/ RETROGRADES Bilateral 07/26/2023   Procedure: CYSTOSCOPY WITH RETROGRADE PYELOGRAM;  Surgeon: Riki Altes, MD;  Location: [facility] ORS;  Service: Urology;  Laterality: Bilateral;   CYSTOSCOPY WITH STENT PLACEMENT Bilateral 07/26/2023   Procedure: CYSTOSCOPY WITH STENT PLACEMENT;  Surgeon: Riki Altes, MD;  Location: [facility] ORS;  Service: Urology;  Laterality: Bilateral;  THORACIC OUTLET SURGERY  07/25/2018   left first rib resection, dissection of subclavian artery, anterior and middle scalenectomy   URETERAL BIOPSY Bilateral 07/26/2023   Procedure: URETERAL BIOPSY;  Surgeon: Riki Altes, MD;  Location: [facility] ORS;  Service: Urology;  Laterality: Bilateral;   URETEROSCOPY Bilateral 07/26/2023   Procedure: DIAGNOSTIC URETEROSCOPY;  Surgeon: Riki Altes, MD;  Location: [facility] ORS;  Service: Urology;  Laterality: Bilateral;    Current Medications: Current Meds  Medication Sig   aspirin EC 81 MG tablet Take 81  mg by mouth every morning.   atorvastatin (LIPITOR) 40 MG tablet Take 20 mg by mouth every morning.   diclofenac Sodium (VOLTAREN) 1 % GEL Apply topically 4 (four) times daily.   losartan (COZAAR) 100 MG tablet Take 100 mg by mouth every morning.   mirabegron ER (MYRBETRIQ) 50 MG TB24 tablet Take 1 tablet (50 mg total) by mouth daily.   MULTIPLE MINERALS PO Take by mouth.   omeprazole (PRILOSEC) 20 MG capsule Take 1 capsule (20 mg total) by mouth daily.   silodosin (RAPAFLO) 8 MG CAPS capsule Take 1 capsule (8 mg total) by mouth daily with breakfast.   Vibegron (GEMTESA) 75 MG TABS Take 1 tablet (75 mg total) by mouth daily.     Allergies:   Penicillins   Social History   Socioeconomic History   Marital status: Married    Spouse name: Kathie Rhodes   Number of children: Not on file   Years of education: Not on file   Highest education level: Not on file  Occupational History   Not on file  Tobacco Use   Smoking status: Some Days    Current packs/day: 0.50    Average packs/day: 0.5 packs/day for 65.0 years (32.5 ttl pk-yrs)    Types: Cigarettes    Start date: 12/08/1958   Smokeless tobacco: Never  Vaping Use   Vaping status: Never Used  Substance and Sexual Activity   Alcohol use: No   Drug use: No   Sexual activity: Not on file  Other Topics Concern   Not on file  Social History Narrative   Not on file   Social Drivers of Health   Financial Resource Strain: Low Risk  (09/29/2023)   Received from [facility]   Overall Financial Resource Strain (CARDIA)    Difficulty of Paying Living Expenses: Not hard at all  Food Insecurity: No Food Insecurity (09/29/2023)   Received from [facility]   Hunger Vital Sign    Worried About Running Out of Food in the Last Year: Never true    Ran Out of Food in the Last Year: Never true  Transportation Needs: No Transportation Needs (09/29/2023)   [facility] from [facility] -  Transportation    In the past 12 months, has lack of transportation kept you from medical appointments or from getting medications?: No    Lack of Transportation (Non-Medical): No  Physical Activity: Not on file  Stress: Not on file  Social Connections: Not on file     Family History: The patient's family history includes Diabetes in his brother; Heart attack in his brother; Lung cancer in his father; Lymphoma in his mother.  ROS:   Please see the history of present illness.     All other systems reviewed and are negative.  EKGs/Labs/Other Studies Reviewed:    The following studies were reviewed today:  EKG Interpretation Date/Time:  Friday December 09 2023 11:54:34  EST Ventricular Rate:  70 PR Interval:  188 QRS Duration:  150 QT Interval:  424 QTC Calculation: 457 R Axis:   -28  Text Interpretation: Normal sinus rhythm Right bundle branch block Confirmed by Debbe Odea ([id number]) on 12/09/2023 11:56:37 AM    Recent Labs: 07/21/2023: BUN 21; Creatinine, Ser 0.99; Potassium 4.5; Sodium 141 09/14/2023: Hemoglobin 11.5; Platelet Count 185  Recent Lipid Panel No results found for: "CHOL", "TRIG", "HDL", "CHOLHDL", "VLDL", "LDLCALC", "LDLDIRECT"   Risk Assessment/Calculations:               Physical Exam:    VS:  BP 132/64 (BP Location: Left Arm, Patient Position: Sitting, Cuff Size: Normal)   Pulse 70   Ht 5\' 10"  (1.778 m)   Wt 176 lb 12.8 oz (80.2 kg)   SpO2 98%   BMI 25.37 kg/m     Wt Readings from Last 3 Encounters:  12/09/23 176 lb 12.8 oz (80.2 kg)  07/20/23 170 lb (77.1 kg)  07/13/23 175 lb (79.4 kg)     GEN: Well nourished, well developed in no acute distress HEENT: Normal NECK: No JVD; No carotid bruits CARDIAC: RRR, no murmurs, rubs, gallops RESPIRATORY: Diminished breath sounds bilaterally, no wheezing ABDOMEN: Soft, non-tender, non-distended MUSCULOSKELETAL:  No edema; No deformity  SKIN: Warm and dry NEUROLOGIC:  Alert and oriented x  3 PSYCHIATRIC:  Normal affect   ASSESSMENT:    1. Pre-procedural cardiovascular examination   2. Coronary artery disease involving native coronary artery of native heart, unspecified whether angina present   3. Primary hypertension   4. Pure hypercholesterolemia   5. Smoking   6. Anginal equivalent (HCC)    PLAN:    In order of problems listed above:  Preprocedural exam, renal artery dissection being planned.  Patient with dyspnea, coronary calcification.  Obtain Lexiscan Myoview to evaluate high risk ischemia.  If no significant ischemia, okay for procedure from a cardiac perspective. CAD s/p PTCA to proximal RCA 1993.  Three-vessel calcifications on chest CT 2024.  Continue aspirin, Lipitor 20 mg daily.  Get echo.  Obtain lipid profile results from PCP. Hypertension, BP controlled.  Continue losartan 100 mg daily. Hyperlipidemia, continue Lipitor 20 mg daily. Current smoker, smoking cessation advised.  Follow-up after cardiac testing     Informed Consent   Shared Decision Making/Informed Consent The risks [chest pain, shortness of breath, cardiac arrhythmias, dizziness, blood pressure fluctuations, myocardial infarction, stroke/transient ischemic attack, nausea, vomiting, allergic reaction, radiation exposure, metallic taste sensation and life-threatening complications (estimated to be 1 in 10,000)], benefits (risk stratification, diagnosing coronary artery disease, treatment guidance) and alternatives of a nuclear stress test were discussed in detail with Seth Rivera and he agrees to proceed.       Medication Adjustments/Labs and Tests Ordered: Current medicines are reviewed at length with the patient today.  Concerns regarding medicines are outlined above.  Orders Placed This Encounter  Procedures   NM Myocar Multi W/Spect W/Wall Motion / EF   EKG 12-Lead   ECHOCARDIOGRAM COMPLETE   Meds ordered this encounter  Medications   omeprazole (PRILOSEC) 20 MG capsule    Sig: Take  1 capsule (20 mg total) by mouth daily.    Dispense:  30 capsule    Refill:  11    Patient Instructions  Medication Instructions:  TAKE over the counter Omeprazole 20 mg once daily.  *If you need a refill on your cardiac medications before your next appointment, please call your pharmacy*   Lab Work: None  If you have labs (blood work) drawn today and your tests are completely normal, you will receive your results only by: MyChart Message (if you have MyChart) OR A paper copy in the mail If you have any lab test that is abnormal or we need to change your treatment, we will call you to review the results.   Testing/Procedures: Your physician has requested that you have an echocardiogram. Echocardiography is a painless test that uses sound waves to create images of your heart. It provides your doctor with information about the size and shape of your heart and how well your heart's chambers and valves are working. This procedure takes approximately one hour. There are no restrictions for this procedure. Please do NOT wear cologne, perfume, aftershave, or lotions (deodorant is allowed). Please arrive 15 minutes prior to your appointment time.  Please note: We ask at that you not bring children with you during ultrasound (echo/ vascular) testing. Due to room size and safety concerns, children are not allowed in the ultrasound rooms during exams. Our front office staff cannot provide observation of children in our lobby area while testing is being conducted. An adult accompanying a patient to their appointment will only be allowed in the ultrasound room at the discretion of the ultrasound technician under special circumstances. We apologize for any inconvenience.  [facility] MYOVIEW  Your Provider has ordered a Stress Test with nuclear imaging. The purpose of this test is to evaluate the blood supply to your heart muscle. This procedure is referred to as a "Non-Invasive Stress Test." This is  because other than having an IV started in your vein, nothing is inserted or "invades" your body. Cardiac stress tests are done to find areas of poor blood flow to the heart by determining the extent of coronary artery disease (CAD). Some patients exercise on a treadmill, which naturally increases the blood flow to your heart, while others who are unable to walk on a treadmill due to physical limitations have a pharmacologic/chemical stress agent called Lexiscan. This medicine will mimic walking on a treadmill by temporarily increasing your coronary blood flow.     REPORT TO [facility] MEDICAL MALL ENTRANCE  **Proceed to the 1st desk on the right, REGISTRATION, to check in**  Please note: this test may take anywhere between 2-4 hours to complete    Instructions regarding medication:   _XXX__:   You may take all of your regular morning medications the day of your test unless listed below.     How to prepare for your Myoview test:  Do not eat or drink for 6 hours prior to the test No caffeine for 24 hours prior to the test No smoking 24 hours prior to the test. Ladies, please do not wear dresses.  Skirts or pants are appropriate. Please wear a short sleeve shirt. No perfume, cologne or lotion. Wear comfortable walking shoes. No heels!   PLEASE NOTIFY THE OFFICE AT LEAST 24 HOURS IN ADVANCE IF YOU ARE UNABLE TO KEEP YOUR APPOINTMENT.  [phone] AND  PLEASE NOTIFY NUCLEAR MEDICINE AT [facility] AT LEAST 24 HOURS IN ADVANCE IF YOU ARE UNABLE TO KEEP YOUR APPOINTMENT. [phone]       Follow-Up: At [facility], you and your health needs are our priority.  As part of our continuing mission to provide you with exceptional heart care, we have created designated Provider Care Teams.  These Care Teams include your primary Cardiologist (physician) and Advanced Practice Providers (APPs -  Physician Assistants and Nurse  Practitioners) who all work together to provide you with the care you  need, when you need it.   Your next appointment:   3 month(s)  Provider:   Debbe Odea, MD      Signed, Debbe Odea, MD  12/09/2023 12:18 PM    [facility]

## 2023-12-09 NOTE — Patient Instructions (Addendum)
Medication Instructions:  TAKE over the counter Omeprazole 20 mg once daily.  *If you need a refill on your cardiac medications before your next appointment, please call your pharmacy*   Lab Work: None  If you have labs (blood work) drawn today and your tests are completely normal, you will receive your results only by: MyChart Message (if you have MyChart) OR A paper copy in the mail If you have any lab test that is abnormal or we need to change your treatment, we will call you to review the results.   Testing/Procedures: Your physician has requested that you have an echocardiogram. Echocardiography is a painless test that uses sound waves to create images of your heart. It provides your doctor with information about the size and shape of your heart and how well your heart's chambers and valves are working. This procedure takes approximately one hour. There are no restrictions for this procedure. Please do NOT wear cologne, perfume, aftershave, or lotions (deodorant is allowed). Please arrive 15 minutes prior to your appointment time.  Please note: We ask at that you not bring children with you during ultrasound (echo/ vascular) testing. Due to room size and safety concerns, children are not allowed in the ultrasound rooms during exams. Our front office staff cannot provide observation of children in our lobby area while testing is being conducted. An adult accompanying a patient to their appointment will only be allowed in the ultrasound room at the discretion of the ultrasound technician under special circumstances. We apologize for any inconvenience.  [facility] MYOVIEW  Your Provider has ordered a Stress Test with nuclear imaging. The purpose of this test is to evaluate the blood supply to your heart muscle. This procedure is referred to as a "Non-Invasive Stress Test." This is because other than having an IV started in your vein, nothing is inserted or "invades" your body. Cardiac stress  tests are done to find areas of poor blood flow to the heart by determining the extent of coronary artery disease (CAD). Some patients exercise on a treadmill, which naturally increases the blood flow to your heart, while others who are unable to walk on a treadmill due to physical limitations have a pharmacologic/chemical stress agent called Lexiscan. This medicine will mimic walking on a treadmill by temporarily increasing your coronary blood flow.     REPORT TO [facility]  **Proceed to the 1st desk on the right, REGISTRATION, to check in**  Please note: this test may take anywhere between 2-4 hours to complete    Instructions regarding medication:   _XXX__:   You may take all of your regular morning medications the day of your test unless listed below.     How to prepare for your Myoview test:  Do not eat or drink for 6 hours prior to the test No caffeine for 24 hours prior to the test No smoking 24 hours prior to the test. Ladies, please do not wear dresses.  Skirts or pants are appropriate. Please wear a short sleeve shirt. No perfume, cologne or lotion. Wear comfortable walking shoes. No heels!   PLEASE NOTIFY THE OFFICE AT LEAST 24 HOURS IN ADVANCE IF YOU ARE UNABLE TO KEEP YOUR APPOINTMENT.  [phone] AND  PLEASE NOTIFY NUCLEAR MEDICINE AT [facility] AT LEAST 24 HOURS IN ADVANCE IF YOU ARE UNABLE TO KEEP YOUR APPOINTMENT. [phone]       Follow-Up: At [facility], you and your health needs are our priority.  As part of our  continuing mission to provide you with exceptional heart care, we have created designated Provider Care Teams.  These Care Teams include your primary Cardiologist (physician) and Advanced Practice Providers (APPs -  Physician Assistants and Nurse Practitioners) who all work together to provide you with the care you need, when you need it.   Your next appointment:   3 month(s)  Provider:   Debbe Odea, MD

## 2023-12-23 DIAGNOSIS — I700 Atherosclerosis of aorta: Secondary | ICD-10-CM | POA: Diagnosis not present

## 2023-12-23 DIAGNOSIS — I110 Hypertensive heart disease with heart failure: Secondary | ICD-10-CM | POA: Diagnosis not present

## 2023-12-23 DIAGNOSIS — C689 Malignant neoplasm of urinary organ, unspecified: Secondary | ICD-10-CM | POA: Diagnosis not present

## 2023-12-23 DIAGNOSIS — C661 Malignant neoplasm of right ureter: Secondary | ICD-10-CM | POA: Diagnosis not present

## 2023-12-23 DIAGNOSIS — E785 Hyperlipidemia, unspecified: Secondary | ICD-10-CM | POA: Diagnosis not present

## 2023-12-23 DIAGNOSIS — Z8554 Personal history of malignant neoplasm of ureter: Secondary | ICD-10-CM | POA: Diagnosis not present

## 2023-12-23 DIAGNOSIS — Z960 Presence of urogenital implants: Secondary | ICD-10-CM | POA: Diagnosis not present

## 2023-12-23 DIAGNOSIS — I509 Heart failure, unspecified: Secondary | ICD-10-CM | POA: Diagnosis not present

## 2023-12-23 DIAGNOSIS — J439 Emphysema, unspecified: Secondary | ICD-10-CM | POA: Diagnosis not present

## 2023-12-23 DIAGNOSIS — C662 Malignant neoplasm of left ureter: Secondary | ICD-10-CM | POA: Diagnosis not present

## 2023-12-23 DIAGNOSIS — I6523 Occlusion and stenosis of bilateral carotid arteries: Secondary | ICD-10-CM | POA: Diagnosis not present

## 2023-12-23 DIAGNOSIS — I2510 Atherosclerotic heart disease of native coronary artery without angina pectoris: Secondary | ICD-10-CM | POA: Diagnosis not present

## 2023-12-26 ENCOUNTER — Ambulatory Visit: Payer: Medicare HMO

## 2023-12-27 ENCOUNTER — Other Ambulatory Visit: Payer: Medicare HMO

## 2023-12-29 DIAGNOSIS — R9389 Abnormal findings on diagnostic imaging of other specified body structures: Secondary | ICD-10-CM | POA: Diagnosis not present

## 2023-12-29 DIAGNOSIS — C662 Malignant neoplasm of left ureter: Secondary | ICD-10-CM | POA: Diagnosis not present

## 2023-12-29 DIAGNOSIS — R9341 Abnormal radiologic findings on diagnostic imaging of renal pelvis, ureter, or bladder: Secondary | ICD-10-CM | POA: Diagnosis not present

## 2023-12-29 DIAGNOSIS — C689 Malignant neoplasm of urinary organ, unspecified: Secondary | ICD-10-CM | POA: Diagnosis not present

## 2024-01-03 ENCOUNTER — Encounter
Admission: RE | Admit: 2024-01-03 | Discharge: 2024-01-03 | Disposition: A | Discharge status: Home / Self Care | Payer: Medicare HMO | Source: Ambulatory Visit | Attending: Cardiology | Admitting: Cardiology

## 2024-01-03 DIAGNOSIS — I2510 Atherosclerotic heart disease of native coronary artery without angina pectoris: Secondary | ICD-10-CM | POA: Diagnosis not present

## 2024-01-03 LAB — NM MYOCAR MULTI W/SPECT W/WALL MOTION / EF
LV dias vol: 105 mL (ref 62–150)
LV sys vol: 29 mL
MPHR: 140 {beats}/min
Nuc Stress EF: 63 %
Peak HR: 81 {beats}/min
Percent HR: 57 %
Rest HR: 75 {beats}/min
Rest Nuclear Isotope Dose: 10.6 mCi
SDS: 4
SRS: 9
SSS: 6
ST Depression (mm): 0 mm
Stress Nuclear Isotope Dose: 30.6 mCi
TID: 0.95

## 2024-01-03 MED ORDER — TECHNETIUM TC 99M TETROFOSMIN IV KIT
10.0000 | PACK | Freq: Once | INTRAVENOUS | Status: AC | PRN
  Administered 2024-01-03: 10.63 via INTRAVENOUS

## 2024-01-03 MED ORDER — TECHNETIUM TC 99M TETROFOSMIN IV KIT
30.6400 | PACK | Freq: Once | INTRAVENOUS | Status: AC | PRN
  Administered 2024-01-03: 30.64 via INTRAVENOUS

## 2024-01-03 MED ORDER — REGADENOSON 0.4 MG/5ML IV SOLN
0.4000 mg | Freq: Once | INTRAVENOUS | Status: AC
  Administered 2024-01-03: 0.4 mg via INTRAVENOUS

## 2024-01-08 DIAGNOSIS — C61 Malignant neoplasm of prostate: Secondary | ICD-10-CM | POA: Diagnosis not present

## 2024-01-08 DIAGNOSIS — C689 Malignant neoplasm of urinary organ, unspecified: Secondary | ICD-10-CM | POA: Diagnosis not present

## 2024-01-09 ENCOUNTER — Ambulatory Visit: Payer: Medicare HMO | Attending: Cardiology

## 2024-01-09 DIAGNOSIS — Z01810 Encounter for preprocedural cardiovascular examination: Secondary | ICD-10-CM

## 2024-01-09 DIAGNOSIS — I2510 Atherosclerotic heart disease of native coronary artery without angina pectoris: Secondary | ICD-10-CM

## 2024-01-09 LAB — ECHOCARDIOGRAM COMPLETE
AV Mean grad: 6 mm[Hg]
AV Peak grad: 12.3 mm[Hg]
Ao pk vel: 1.75 m/s
Area-P 1/2: 3.42 cm2
S' Lateral: 3.1 cm

## 2024-01-11 DIAGNOSIS — C61 Malignant neoplasm of prostate: Secondary | ICD-10-CM | POA: Diagnosis not present

## 2024-01-11 DIAGNOSIS — C689 Malignant neoplasm of urinary organ, unspecified: Secondary | ICD-10-CM | POA: Diagnosis not present

## 2024-01-15 DIAGNOSIS — R918 Other nonspecific abnormal finding of lung field: Secondary | ICD-10-CM | POA: Diagnosis not present

## 2024-01-15 DIAGNOSIS — C689 Malignant neoplasm of urinary organ, unspecified: Secondary | ICD-10-CM | POA: Diagnosis not present

## 2024-01-15 DIAGNOSIS — C662 Malignant neoplasm of left ureter: Secondary | ICD-10-CM | POA: Diagnosis not present

## 2024-01-16 ENCOUNTER — Ambulatory Visit: Payer: Medicare HMO | Admitting: Physician Assistant

## 2024-01-16 VITALS — BP 147/82 | HR 63 | Ht 70.0 in | Wt 174.6 lb

## 2024-01-16 DIAGNOSIS — C61 Malignant neoplasm of prostate: Secondary | ICD-10-CM | POA: Diagnosis not present

## 2024-01-16 MED ORDER — LEUPROLIDE ACETATE (6 MONTH) 45 MG ~~LOC~~ KIT
45.0000 mg | PACK | Freq: Once | SUBCUTANEOUS | Status: AC
  Administered 2024-01-16: 45 mg via SUBCUTANEOUS

## 2024-01-16 NOTE — Progress Notes (Signed)
Eligard SubQ Injection   Due to Prostate Cancer patient is present today for a Eligard Injection.  Medication: Eligard 6 month Dose: 45 mg  Location: right UOQ Lot: [id number] Exp: 05/2025  Patient tolerated well, no complications were noted  Performed by: Rice Walsh H RMA  Per Vivi Barrack, PA patient is to continue therapy until 06/2025 Patient's next follow up was scheduled for 07/12/24. This appointment was scheduled using wheel and given to patient today along with reminder continue on Vitamin D 800-1000iu and Calcium 1000-1200 mg daily while on Androgen Deprivation Therapy.  PA approval dates:  No PA required

## 2024-01-16 NOTE — Patient Instructions (Addendum)
Call Dr Kipp Laurence office to schedule follow up at [phone]  Take Vitamin D 800-1000iu and Calcium 1000-1200mg 

## 2024-02-01 DIAGNOSIS — C689 Malignant neoplasm of urinary organ, unspecified: Secondary | ICD-10-CM | POA: Diagnosis not present

## 2024-02-01 DIAGNOSIS — Z466 Encounter for fitting and adjustment of urinary device: Secondary | ICD-10-CM | POA: Diagnosis not present

## 2024-02-01 DIAGNOSIS — N135 Crossing vessel and stricture of ureter without hydronephrosis: Secondary | ICD-10-CM | POA: Diagnosis not present

## 2024-02-23 ENCOUNTER — Ambulatory Visit: Payer: Self-pay | Admitting: Urology

## 2024-02-23 ENCOUNTER — Encounter: Payer: Self-pay | Admitting: Urology

## 2024-02-23 VITALS — BP 123/71 | HR 64 | Ht 70.0 in | Wt 174.0 lb

## 2024-02-23 DIAGNOSIS — C689 Malignant neoplasm of urinary organ, unspecified: Secondary | ICD-10-CM | POA: Diagnosis not present

## 2024-02-23 DIAGNOSIS — C61 Malignant neoplasm of prostate: Secondary | ICD-10-CM

## 2024-02-23 NOTE — Progress Notes (Signed)
 I, [name], acting as a scribe for Seth Altes, MD., have documented all relevant documentation on the behalf of Seth Altes, MD, as directed by Seth Altes, MD while in the presence of Seth Altes, MD.  02/23/2024 11:44 AM   Seth Rivera Dec 15, 1946 [id number]  Referring provider: Mick Sell, MD [address] [place],  [place] [address]  Chief Complaint  Patient presents with   Prostate Cancer    HPI: Seth Rivera is a 78 y.o. male presents for a follow-up visit.  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 He has a follow-up with Dr. Leone Payor next week and is tentatively scheduled for bilateral surveillance ureteroscopy May 2025 He has no complaints today.   PMH: Past Medical History:  Diagnosis Date   Allergic rhinitis    Bilateral carotid artery stenosis    Bladder-neck obstruction    Carotid stenosis    Colon polyp    COPD (chronic obstructive pulmonary disease) (HCC)    Coronary artery disease    Coronary atherosclerosis of native coronary artery    Diabetes mellitus without complication (HCC)    NO medications (patient state borderline diabetes)   Elevated PSA    Gastric ulcer    History of non-ST elevation myocardial infarction (NSTEMI)    Hyperlipidemia    Hypertension    Ischemic finger    Malignant neoplasm of prostate (HCC)    stage ll C   Myocardial infarction [facility])    Personal history of tobacco use, presenting hazards to health 05/20/2016   Pulmonary granuloma (HCC)    Pulmonary granuloma (HCC)    Pure hypercholesterolemia    Restless legs syndrome (RLS)    Squamous cell carcinoma in situ     Thoracic outlet syndrome    Tobacco use disorder    Tubular adenoma of colon    Tubular adenoma of colon    Type 2 diabetes mellitus without complication (HCC)     Surgical History: Past Surgical History:  Procedure Laterality Date   APPENDECTOMY  1949   CAROTID ENDARTERECTOMY     CATARACT EXTRACTION  2014   COLONOSCOPY  2014   COLONOSCOPY WITH PROPOFOL N/A 09/06/2016   Procedure: COLONOSCOPY WITH PROPOFOL;  Surgeon: Scot Jun, MD;  Location: [facility] ENDOSCOPY;  Service: Endoscopy;  Laterality: N/A;   COLONOSCOPY WITH PROPOFOL N/A 02/28/2020   Procedure: COLONOSCOPY WITH PROPOFOL;  Surgeon: Toledo, Boykin Nearing, MD;  Location: [facility] ENDOSCOPY;  Service: Gastroenterology;  Laterality: N/A;   CORONARY ANGIOPLASTY  1999   CYSTOSCOPY W/ RETROGRADES Bilateral 07/26/2023   Procedure: CYSTOSCOPY WITH RETROGRADE PYELOGRAM;  Surgeon: Seth Altes, MD;  Location: [facility] ORS;  Service: Urology;  Laterality: Bilateral;   CYSTOSCOPY WITH STENT PLACEMENT Bilateral 07/26/2023   Procedure: CYSTOSCOPY WITH STENT PLACEMENT;  Surgeon: Seth Altes, MD;  Location: [facility] ORS;  Service: Urology;  Laterality: Bilateral;   THORACIC OUTLET SURGERY  07/25/2018   left first rib resection, dissection of subclavian artery, anterior and middle scalenectomy   URETERAL BIOPSY Bilateral 07/26/2023   Procedure: URETERAL BIOPSY;  Surgeon: Seth Altes, MD;  Location: [facility] ORS;  Service: Urology;  Laterality: Bilateral;   URETEROSCOPY Bilateral  07/26/2023   Procedure: DIAGNOSTIC URETEROSCOPY;  Surgeon: Seth Altes, MD;  Location: [facility] ORS;  Service: Urology;  Laterality: Bilateral;    Home Medications:  Allergies as of 02/23/2024       Reactions   Penicillins Hives   Has patient had a PCN reaction causing immediate rash, facial/tongue/throat swelling, SOB or lightheadedness with hypotension: Yes Has patient had a PCN reaction causing severe rash involving mucus membranes or skin necrosis: No Has patient  had a PCN reaction that required hospitalization: No Has patient had a PCN reaction occurring within the last 10 years: No If all of the above answers are "NO", then may proceed with Cephalosporin use.        Medication List        Accurate as of February 23, 2024 11:44 AM. If you have any questions, ask your nurse or doctor.          aspirin EC 81 MG tablet Take 81 mg by mouth every morning.   atorvastatin 40 MG tablet Commonly known as: LIPITOR Take 20 mg by mouth every morning.   losartan 100 MG tablet Commonly known as: COZAAR Take 100 mg by mouth every morning.        Allergies:  Allergies  Allergen Reactions   Penicillins Hives    Has patient had a PCN reaction causing immediate rash, facial/tongue/throat swelling, SOB or lightheadedness with hypotension: Yes Has patient had a PCN reaction causing severe rash involving mucus membranes or skin necrosis: No Has patient had a PCN reaction that required hospitalization: No Has patient had a PCN reaction occurring within the last 10 years: No If all of the above answers are "NO", then may proceed with Cephalosporin use.     Family History: Family History  Problem Relation Age of Onset   Lymphoma Mother    Lung cancer Father    Heart attack Brother    Diabetes Brother     Social History:  reports that he has been smoking cigarettes. He started smoking about 65 years ago. He has a 32.6 pack-year smoking history. He has never used smokeless tobacco. He reports that he does not drink alcohol and does not use drugs.   Physical Exam: BP 123/71   Pulse 64   Ht 5\' 10"  (1.778 m)   Wt 174 lb (78.9 kg)   BMI 24.97 kg/m   Constitutional:  Alert and oriented, No acute distress. HEENT: [place] AT Respiratory: Normal respiratory effort, no increased work of breathing. Psychiatric: Normal mood and affect.  Assessment & Plan:    1. Very high-risk prostate cancer Stable PSA Due for Leuprolide injection July 2025.  2.  High-grade urothelial carcinoma bilateral upper tracts Currently managed [facility] urology.  I have reviewed the above documentation for accuracy and completeness, and I agree with the above.   Seth Altes, MD  [facility] [address] [place], [place] [address] [phone]

## 2024-03-08 ENCOUNTER — Encounter: Payer: Self-pay | Admitting: Cardiology

## 2024-03-08 ENCOUNTER — Ambulatory Visit: Payer: Medicare HMO | Attending: Cardiology | Admitting: Cardiology

## 2024-03-08 VITALS — BP 120/76 | HR 67 | Ht 70.0 in | Wt 176.2 lb

## 2024-03-08 DIAGNOSIS — I2510 Atherosclerotic heart disease of native coronary artery without angina pectoris: Secondary | ICD-10-CM

## 2024-03-08 DIAGNOSIS — F17200 Nicotine dependence, unspecified, uncomplicated: Secondary | ICD-10-CM

## 2024-03-08 DIAGNOSIS — E7800 Pure hypercholesterolemia, unspecified: Secondary | ICD-10-CM | POA: Diagnosis not present

## 2024-03-08 DIAGNOSIS — Z01810 Encounter for preprocedural cardiovascular examination: Secondary | ICD-10-CM | POA: Diagnosis not present

## 2024-03-08 DIAGNOSIS — I10 Essential (primary) hypertension: Secondary | ICD-10-CM

## 2024-03-08 NOTE — Patient Instructions (Signed)
 Medication Instructions:  No changes *If you need a refill on your cardiac medications before your next appointment, please call your pharmacy*  Lab Work: No labs  Testing/Procedures: No testing  Follow-Up: At [facility], you and your health needs are our priority.  As part of our continuing mission to provide you with exceptional heart care, we have created designated Provider Care Teams.  These Care Teams include your primary Cardiologist (physician) and Advanced Practice Providers (APPs -  Physician Assistants and Nurse Practitioners) who all work together to provide you with the care you need, when you need it.  We recommend signing up for the patient portal called "MyChart".  Sign up information is provided on this After Visit Summary.  MyChart is used to connect with patients for Virtual Visits (Telemedicine).  Patients are able to view lab/test results, encounter notes, upcoming appointments, etc.  Non-urgent messages can be sent to your provider as well.   To learn more about what you can do with MyChart, go to [email or web address].    Your next appointment:   1 year(s)  Provider:   Debbe Odea, MD

## 2024-03-08 NOTE — Progress Notes (Signed)
 Cardiology Office Note:    Date:  03/08/2024   ID:  Seth Rivera, Seth Rivera 04/09/1946, MRN [id number]  PCP:  Mick Sell, MD   [facility] Providers Cardiologist:  Debbe Odea, MD     Referring MD: Mick Sell, MD   No chief complaint on file.   History of Present Illness:    Seth Rivera is a 78 y.o. male with a hx of NSTEMI s/p PTCA to prox RCA in 1993, hypertension, hyperlipidemia, PAD s/p right carotid endarterectomy, smoker x 60+ years, COPD who presents for follow-up.    He was previously seen to establish care and also preop evaluation.  Diagnosed with renal cancer, resection is being planned.  Also diagnosed with prostate cancer.  He still smokes.  Echocardiogram and Lexiscan Myoview was obtained to evaluate cardiac function.  Presents for results.  Denies any chest pain or shortness of breath.  Wife is currently admitted in the hospital with a pneumonia.  Prior notes/testing Chest CT 08/2023 LAD, RCA, left circumflex coronary calcifications. Stress echo 2018 LVEF over 55%.  No evidence of scar.  Past Medical History:  Diagnosis Date   Allergic rhinitis    Bilateral carotid artery stenosis    Bladder-neck obstruction    Carotid stenosis    Colon polyp    COPD (chronic obstructive pulmonary disease) (HCC)    Coronary artery disease    Coronary atherosclerosis of native coronary artery    Diabetes mellitus without complication (HCC)    NO medications (patient state borderline diabetes)   Elevated PSA    Gastric ulcer    History of non-ST elevation myocardial infarction (NSTEMI)    Hyperlipidemia    Hypertension    Ischemic finger    Malignant neoplasm of prostate (HCC)    stage ll C   Myocardial infarction [facility])    Personal history of tobacco use, presenting hazards to health 05/20/2016   Pulmonary granuloma (HCC)    Pulmonary granuloma (HCC)    Pure hypercholesterolemia    Restless legs syndrome (RLS)    Squamous cell carcinoma in  situ    Thoracic outlet syndrome    Tobacco use disorder    Tubular adenoma of colon    Tubular adenoma of colon    Type 2 diabetes mellitus without complication (HCC)     Past Surgical History:  Procedure Laterality Date   APPENDECTOMY  1949   CAROTID ENDARTERECTOMY     CATARACT EXTRACTION  2014   COLONOSCOPY  2014   COLONOSCOPY WITH PROPOFOL N/A 09/06/2016   Procedure: COLONOSCOPY WITH PROPOFOL;  Surgeon: Scot Jun, MD;  Location: [facility] ENDOSCOPY;  Service: Endoscopy;  Laterality: N/A;   COLONOSCOPY WITH PROPOFOL N/A 02/28/2020   Procedure: COLONOSCOPY WITH PROPOFOL;  Surgeon: Toledo, Boykin Nearing, MD;  Location: [facility] ENDOSCOPY;  Service: Gastroenterology;  Laterality: N/A;   CORONARY ANGIOPLASTY  1999   CYSTOSCOPY W/ RETROGRADES Bilateral 07/26/2023   Procedure: CYSTOSCOPY WITH RETROGRADE PYELOGRAM;  Surgeon: Riki Altes, MD;  Location: [facility] ORS;  Service: Urology;  Laterality: Bilateral;   CYSTOSCOPY WITH STENT PLACEMENT Bilateral 07/26/2023   Procedure: CYSTOSCOPY WITH STENT PLACEMENT;  Surgeon: Riki Altes, MD;  Location: [facility] ORS;  Service: Urology;  Laterality: Bilateral;   THORACIC OUTLET SURGERY  07/25/2018   left first rib resection, dissection of subclavian artery, anterior and middle scalenectomy   URETERAL BIOPSY Bilateral 07/26/2023   Procedure: URETERAL BIOPSY;  Surgeon: Riki Altes, MD;  Location: [facility] ORS;  Service: Urology;  Laterality: Bilateral;   URETEROSCOPY Bilateral 07/26/2023   Procedure: DIAGNOSTIC URETEROSCOPY;  Surgeon: Riki Altes, MD;  Location: [facility] ORS;  Service: Urology;  Laterality: Bilateral;    Current Medications: Current Meds  Medication Sig   aspirin EC 81 MG tablet Take 81 mg by mouth every morning.   atorvastatin (LIPITOR) 40 MG tablet Take 20 mg by mouth every morning.   losartan (COZAAR) 100 MG tablet Take 100 mg by mouth every morning.     Allergies:   Penicillins   Social History   Socioeconomic History   Marital  status: Married    Spouse name: Kathie Rhodes   Number of children: Not on file   Years of education: Not on file   Highest education level: Not on file  Occupational History   Not on file  Tobacco Use   Smoking status: Some Days    Current packs/day: 0.50    Average packs/day: 0.5 packs/day for 65.2 years (32.6 ttl pk-yrs)    Types: Cigarettes    Start date: 12/08/1958   Smokeless tobacco: Never  Vaping Use   Vaping status: Never Used  Substance and Sexual Activity   Alcohol use: No   Drug use: No   Sexual activity: Not on file  Other Topics Concern   Not on file  Social History Narrative   Not on file   Social Drivers of Health   Financial Resource Strain: Low Risk  (09/29/2023)   Received from [facility]   Overall Financial Resource Strain (CARDIA)    Difficulty of Paying Living Expenses: Not hard at all  Food Insecurity: No Food Insecurity (09/29/2023)   Received from [facility]   Hunger Vital Sign    Worried About Running Out of Food in the Last Year: Never true    Ran Out of Food in the Last Year: Never true  Transportation Needs: No Transportation Needs (09/29/2023)   Received from [facility]    In the past 12 months, has lack of transportation kept you from medical appointments or from getting medications?: No    Lack of Transportation (Non-Medical): No  Physical Activity: Not on file  Stress: Not on file  Social Connections: Not on file     Family History: The patient's family history includes Diabetes in his brother; Heart attack in his brother; Lung cancer in his father; Lymphoma in his mother.  ROS:   Please see the history of present illness.     All other systems reviewed and are negative.  EKGs/Labs/Other Studies Reviewed:    The following studies were reviewed today:       Recent Labs: 07/21/2023: BUN 21; Creatinine, Ser 0.99; Potassium 4.5; Sodium 141 09/14/2023:  Hemoglobin 11.5; Platelet Count 185  Recent Lipid Panel No results found for: "CHOL", "TRIG", "HDL", "CHOLHDL", "VLDL", "LDLCALC", "LDLDIRECT"   Risk Assessment/Calculations:               Physical Exam:    VS:  BP 120/76 (BP Location: Left Arm, Patient Position: Sitting, Cuff Size: Normal)   Pulse 67   Ht 5\' 10"  (1.778 m)   Wt 176 lb 3.2 oz (79.9 kg)   SpO2 97%   BMI 25.28 kg/m     Wt Readings from Last 3 Encounters:  03/08/24 176 lb 3.2 oz (79.9 kg)  02/23/24 174 lb (78.9 kg)  01/16/24 174 lb 9 oz (79.2 kg)     GEN: Well nourished, well  developed in no acute distress HEENT: Normal NECK: No JVD; No carotid bruits CARDIAC: RRR, no murmurs, rubs, gallops RESPIRATORY: Diminished breath sounds bilaterally, no wheezing ABDOMEN: Soft, non-tender, non-distended MUSCULOSKELETAL:  No edema; No deformity  SKIN: Warm and dry NEUROLOGIC:  Alert and oriented x 3 PSYCHIATRIC:  Normal affect   ASSESSMENT:    1. Pre-procedural cardiovascular examination   2. Coronary artery disease involving native coronary artery of native heart, unspecified whether angina present   3. Primary hypertension   4. Pure hypercholesterolemia   5. Smoking     PLAN:    In order of problems listed above:  Preprocedural exam, renal artery resection being planned.  Lexiscan Myoview and echocardiogram with no significant abnormalities.  Okay for procedure from a cardiac perspective. CAD s/p PTCA to proximal RCA 1993.  Three-vessel calcifications on chest CT 2024.  Lexiscan Myoview 12/2023 no evidence for ischemia, low risk study.  Echo 12/2023 EF 60 to 65%.  Continue aspirin, Lipitor 20 mg daily. Hypertension, BP controlled.  Continue losartan 100 mg daily. Hyperlipidemia, continue Lipitor 20 mg daily. Current smoker, smoking cessation advised.  Follow-up in 12 months.     Medication Adjustments/Labs and Tests Ordered: Current medicines are reviewed at length with the patient today.  Concerns  regarding medicines are outlined above.  No orders of the defined types were placed in this encounter.  No orders of the defined types were placed in this encounter.   Patient Instructions  Medication Instructions:  No changes *If you need a refill on your cardiac medications before your next appointment, please call your pharmacy*  Lab Work: No labs  Testing/Procedures: No testing  Follow-Up: At [facility], you and your health needs are our priority.  As part of our continuing mission to provide you with exceptional heart care, we have created designated Provider Care Teams.  These Care Teams include your primary Cardiologist (physician) and Advanced Practice Providers (APPs -  Physician Assistants and Nurse Practitioners) who all work together to provide you with the care you need, when you need it.  We recommend signing up for the patient portal called "MyChart".  Sign up information is provided on this After Visit Summary.  MyChart is used to connect with patients for Virtual Visits (Telemedicine).  Patients are able to view lab/test results, encounter notes, upcoming appointments, etc.  Non-urgent messages can be sent to your provider as well.   To learn more about what you can do with MyChart, go to [email or web address].    Your next appointment:   1 year(s)  Provider:   Debbe Odea, MD     Signed, Debbe Odea, MD  03/08/2024 10:49 AM    [facility]

## 2024-03-11 ENCOUNTER — Emergency Department

## 2024-03-11 ENCOUNTER — Emergency Department
Admission: EM | Admit: 2024-03-11 | Discharge: 2024-03-11 | Disposition: A | Discharge status: Home / Self Care | Attending: Emergency Medicine | Admitting: Emergency Medicine

## 2024-03-11 DIAGNOSIS — Z7901 Long term (current) use of anticoagulants: Secondary | ICD-10-CM | POA: Insufficient documentation

## 2024-03-11 DIAGNOSIS — M549 Dorsalgia, unspecified: Secondary | ICD-10-CM | POA: Diagnosis not present

## 2024-03-11 DIAGNOSIS — Z85528 Personal history of other malignant neoplasm of kidney: Secondary | ICD-10-CM | POA: Diagnosis not present

## 2024-03-11 DIAGNOSIS — Z8546 Personal history of malignant neoplasm of prostate: Secondary | ICD-10-CM | POA: Insufficient documentation

## 2024-03-11 DIAGNOSIS — M48061 Spinal stenosis, lumbar region without neurogenic claudication: Secondary | ICD-10-CM

## 2024-03-11 DIAGNOSIS — R200 Anesthesia of skin: Secondary | ICD-10-CM | POA: Insufficient documentation

## 2024-03-11 MED ORDER — PREDNISONE 20 MG PO TABS
60.0000 mg | ORAL_TABLET | Freq: Once | ORAL | Status: AC
  Administered 2024-03-11: 60 mg via ORAL
  Filled 2024-03-11: qty 3

## 2024-03-11 MED ORDER — PREDNISONE 50 MG PO TABS: ORAL_TABLET | ORAL | 0 refills | Status: DC

## 2024-03-11 NOTE — Discharge Instructions (Addendum)
 Been diagnosed with lumbar foraminal stenosis.  Please take prednisone 1 tablet by mouth with breakfast for the next 4 days.  Please call Dr. Joice Lofts and make an appointment for a follow-up.  Come back to ED or go to your PCP if you have new symptoms or symptoms worsen

## 2024-03-11 NOTE — ED Provider Notes (Signed)
 [facility] Provider Note    Event Date/Time   First MD Initiated Contact with Patient 03/11/24 1834     (approximate)   History   Back Pain   HPI  Seth Rivera is a 78 y.o. male who presents today with history of 2 years of back pain.  Patient states he was diagnosed with prostate cancer, kidney cancer.  Pain is woken him up at nights.  Patient is taking Tylenol 1000 mg every 8 hours without improvement of the pain.  Patient is taking blood thinners.  Has been seeing in [facility] for oncology treatment.  Patient denies urinary incontinence fecal incontinence urinary retention.  Patient states pain increased when he is standing up and radiates to both posterior area of legs.  Patient states numbness on his toes     Physical Exam   Triage Vital Signs: ED Triage Vitals  Encounter Vitals Group     BP 03/11/24 1735 121/70     Systolic BP Percentile --      Diastolic BP Percentile --      Pulse Rate 03/11/24 1733 84     Resp 03/11/24 1733 18     Temp 03/11/24 1733 97.7 F (36.5 C)     Temp Source 03/11/24 1733 Oral     SpO2 03/11/24 1733 96 %     Weight 03/11/24 1734 173 lb (78.5 kg)     Height 03/11/24 1734 5\' 10"  (1.778 m)     Head Circumference --      Peak Flow --      Pain Score 03/11/24 1733 9     Pain Loc --      Pain Education --      Exclude from Growth Chart --     Most recent vital signs: Vitals:   03/11/24 1733 03/11/24 1735  BP:  121/70  Pulse: 84   Resp: 18   Temp: 97.7 F (36.5 C)   SpO2: 96%      Constitutional: Alert, NAD. Able to speak in complete sentences without cough or dyspnea  Eyes: Conjunctivae are normal.  Head: Atraumatic. Nose: No congestion/rhinnorhea. Mouth/Throat: Mucous membranes are moist.   Neck: Painless ROM. Supple. No JVD, nodes, thyromegaly  Cardiovascular:   Good peripheral circulation.RRR no murmurs, gallops, rubs  Respiratory: Normal respiratory effort.  No retractions. Clear to auscultation  bilaterally without wheezing or crackles  Gastrointestinal: Soft and nontender.  Musculoskeletal:  no deformity.  Lumbar spine: Skin intact, no ecchymosis no hematoma no tender to palpation in the spinal or paraspinal area.  Cauda equina negative Neurologic:  MAE spontaneously. No gross focal neurologic deficits are appreciated.  Skin:  Skin is warm, dry and intact. No rash noted. Psychiatric: Mood and affect are normal. Speech and behavior are normal.    ED Results / Procedures / Treatments   Labs (all labs ordered are listed, but only abnormal results are displayed) Labs Reviewed - No data to display   EKG     RADIOLOGY I independently reviewed and interpreted imaging and agree with radiologists findings.      PROCEDURES:  Critical Care performed:   Procedures   MEDICATIONS ORDERED IN ED: Medications - No data to display Clinical Course as of 03/11/24 2009  Sun Mar 11, 2024  2009 CT Lumbar Spine Wo Contrast  No acute or healing fractures. 2. Progressive endplate sclerotic changes at L5-S1 likely reflect progressive degenerative disc disease. 3. Moderate to severe right and mild left subarticular stenosis at  L4-5. 4. Severe right and moderate left foraminal stenosis at L4-5. 5. Moderate right and mild left subarticular stenosis at L5-S1. 6. Severe right and moderate left foraminal stenosis at L5-S1. 7. Mild left foraminal stenosis at L2-3. 8. Mild bilateral foraminal stenosis at L3-4. 9. Diffuse bladder wall thickening is nonspecific. This may be related to chronic bladder outlet obstruction. 10. Bilateral ureteral stents are in place. 11. Colonic diverticulosis without diverticulitis. 12.  Aortic Atherosclerosis (ICD10-I70.0).   [AE]    Clinical Course User Index [AE] Gladys Damme, PA-C    IMPRESSION / MDM / ASSESSMENT AND PLAN / ED COURSE  I reviewed the triage vital signs and the nursing notes.  Differential diagnosis includes, but is not  limited to, fracture, muscle strain, cancer, mass  Patient's presentation is most consistent with acute complicated illness / injury requiring diagnostic workup.   Patient's diagnosis is consistent with left foraminal stenosis at L2 extending to S1. I independently reviewed and interpreted imaging and agree with radiologists findings. I did review the patient's allergies and medication.The patient is in stable and satisfactory condition for discharge home  Patient will be discharged home with prescriptions for prednisone. Patient is to follow up with orthopedics as needed or otherwise directed. Patient is given ED precautions to return to the ED for any worsening or new symptoms. Discussed plan of care with patient, answered all of patient's questions, Patient agreeable to plan of care. Advised patient to take medications according to the instructions on the label. Discussed possible side effects of new medications. Patient verbalized understanding.    FINAL CLINICAL IMPRESSION(S) / ED DIAGNOSES   Final diagnoses:  None     Rx / DC Orders   ED Discharge Orders     None        Note:  This document was prepared using Dragon voice recognition software and may include unintentional dictation errors.   Gladys Damme, PA-C 03/11/24 2014    Minna Antis, MD 03/12/24 1759

## 2024-03-11 NOTE — ED Triage Notes (Signed)
 Pt presents to the ED via POV from home by self. Pt reports bilateral lower back pain. Denies injury or trauma. States that this has been going on for about 2 years and steadily worsening.

## 2024-05-01 DIAGNOSIS — M5442 Lumbago with sciatica, left side: Secondary | ICD-10-CM | POA: Diagnosis not present

## 2024-05-01 DIAGNOSIS — M5441 Lumbago with sciatica, right side: Secondary | ICD-10-CM | POA: Diagnosis not present

## 2024-05-01 DIAGNOSIS — G8929 Other chronic pain: Secondary | ICD-10-CM | POA: Diagnosis not present

## 2024-05-01 DIAGNOSIS — M5416 Radiculopathy, lumbar region: Secondary | ICD-10-CM | POA: Diagnosis not present

## 2024-05-08 ENCOUNTER — Encounter (INDEPENDENT_AMBULATORY_CARE_PROVIDER_SITE_OTHER): Payer: Self-pay

## 2024-05-11 DIAGNOSIS — M5416 Radiculopathy, lumbar region: Secondary | ICD-10-CM | POA: Diagnosis not present

## 2024-05-11 DIAGNOSIS — E119 Type 2 diabetes mellitus without complications: Secondary | ICD-10-CM | POA: Diagnosis not present

## 2024-05-23 DIAGNOSIS — M5441 Lumbago with sciatica, right side: Secondary | ICD-10-CM | POA: Diagnosis not present

## 2024-05-23 DIAGNOSIS — M5416 Radiculopathy, lumbar region: Secondary | ICD-10-CM | POA: Diagnosis not present

## 2024-05-23 DIAGNOSIS — M5442 Lumbago with sciatica, left side: Secondary | ICD-10-CM | POA: Diagnosis not present

## 2024-05-23 DIAGNOSIS — G8929 Other chronic pain: Secondary | ICD-10-CM | POA: Diagnosis not present

## 2024-06-05 DIAGNOSIS — C689 Malignant neoplasm of urinary organ, unspecified: Secondary | ICD-10-CM | POA: Diagnosis not present

## 2024-06-05 DIAGNOSIS — C61 Malignant neoplasm of prostate: Secondary | ICD-10-CM | POA: Diagnosis not present

## 2024-06-11 DIAGNOSIS — R82998 Other abnormal findings in urine: Secondary | ICD-10-CM | POA: Diagnosis not present

## 2024-06-11 DIAGNOSIS — C689 Malignant neoplasm of urinary organ, unspecified: Secondary | ICD-10-CM | POA: Diagnosis not present

## 2024-06-15 DIAGNOSIS — N289 Disorder of kidney and ureter, unspecified: Secondary | ICD-10-CM | POA: Diagnosis not present

## 2024-06-15 DIAGNOSIS — N135 Crossing vessel and stricture of ureter without hydronephrosis: Secondary | ICD-10-CM | POA: Diagnosis not present

## 2024-06-15 DIAGNOSIS — N2889 Other specified disorders of kidney and ureter: Secondary | ICD-10-CM | POA: Diagnosis not present

## 2024-06-15 DIAGNOSIS — I110 Hypertensive heart disease with heart failure: Secondary | ICD-10-CM | POA: Diagnosis not present

## 2024-06-15 DIAGNOSIS — I252 Old myocardial infarction: Secondary | ICD-10-CM | POA: Diagnosis not present

## 2024-06-15 DIAGNOSIS — F17210 Nicotine dependence, cigarettes, uncomplicated: Secondary | ICD-10-CM | POA: Diagnosis not present

## 2024-06-15 DIAGNOSIS — K219 Gastro-esophageal reflux disease without esophagitis: Secondary | ICD-10-CM | POA: Diagnosis not present

## 2024-06-15 DIAGNOSIS — J449 Chronic obstructive pulmonary disease, unspecified: Secondary | ICD-10-CM | POA: Diagnosis not present

## 2024-06-15 DIAGNOSIS — I2510 Atherosclerotic heart disease of native coronary artery without angina pectoris: Secondary | ICD-10-CM | POA: Diagnosis not present

## 2024-06-15 DIAGNOSIS — Z880 Allergy status to penicillin: Secondary | ICD-10-CM | POA: Diagnosis not present

## 2024-06-15 DIAGNOSIS — Z8554 Personal history of malignant neoplasm of ureter: Secondary | ICD-10-CM | POA: Diagnosis not present

## 2024-06-15 DIAGNOSIS — I509 Heart failure, unspecified: Secondary | ICD-10-CM | POA: Diagnosis not present

## 2024-07-03 ENCOUNTER — Other Ambulatory Visit: Payer: Self-pay | Admitting: *Deleted

## 2024-07-03 DIAGNOSIS — R972 Elevated prostate specific antigen [PSA]: Secondary | ICD-10-CM

## 2024-07-04 ENCOUNTER — Other Ambulatory Visit: Payer: Self-pay

## 2024-07-04 DIAGNOSIS — R972 Elevated prostate specific antigen [PSA]: Secondary | ICD-10-CM | POA: Diagnosis not present

## 2024-07-05 LAB — PSA: Prostate Specific Ag, Serum: <0.1 ng/mL (ref 0.0–4.0)

## 2024-07-05 NOTE — Telephone Encounter (Signed)
 Sorry about that. I have him scheduled for both apts.    Thanks, [employer]

## 2024-07-11 DIAGNOSIS — Z0100 Encounter for examination of eyes and vision without abnormal findings: Secondary | ICD-10-CM | POA: Diagnosis not present

## 2024-07-11 DIAGNOSIS — H524 Presbyopia: Secondary | ICD-10-CM | POA: Diagnosis not present

## 2024-07-12 ENCOUNTER — Ambulatory Visit: Payer: Self-pay | Admitting: Physician Assistant

## 2024-07-12 VITALS — BP 115/72 | HR 70 | Ht 70.0 in | Wt 177.0 lb

## 2024-07-12 DIAGNOSIS — G4700 Insomnia, unspecified: Secondary | ICD-10-CM

## 2024-07-12 DIAGNOSIS — C61 Malignant neoplasm of prostate: Secondary | ICD-10-CM | POA: Diagnosis not present

## 2024-07-12 MED ORDER — LEUPROLIDE ACETATE (6 MONTH) 45 MG ~~LOC~~ KIT
45.0000 mg | PACK | Freq: Once | SUBCUTANEOUS | Status: AC
Start: 2024-07-12 — End: 2024-07-12
  Administered 2024-07-12: 45 mg via SUBCUTANEOUS

## 2024-07-12 NOTE — Progress Notes (Signed)
 Eligard  SubQ Injection    Due to Prostate Cancer patient is present today for a Eligard  Injection.   Medication: Eligard  6 month Dose: 45 mg  Location: right UOQ Lot: [id number] Exp: 08/2025   Patient tolerated well, no complications were noted   Performed by: Nicoya Friel Magallon-Mariche, RMA   Per Lucie Roes, PA patient is to continue therapy until 06/2025 Patient's next follow up was scheduled for 01/16/25. This appointment was scheduled using wheel and given to patient today along with reminder continue on Vitamin D 800-1000iu and Calcium 1000-1200 mg daily while on Androgen Deprivation Therapy.  PA approval dates:  No PA required  Additional notes: PSA undetectable, great news. He reports difficulty falling and staying asleep since his cancer diagnoses. He attributes this to worrying about his health status. 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 He has tried melatonin but it did not help. Daytime worrying is not particularly bothersome and he primarily is looking for help sleeping. I offered him a referral to the cancer center or CBT for individual counseling or support groups, but he declined.  I reached out to Dr. Epifanio to request sleep medication for him. Samantha Vaillancourt, PA-C

## 2024-07-12 NOTE — Patient Instructions (Signed)
 Please take the following dietary supplements for the duration of your hormone suppression therapy to reduce your risk for bone loss: -Calcium 1000-1200mg  daily -Vitamin D 800-1000IU daily

## 2024-07-24 DIAGNOSIS — C689 Malignant neoplasm of urinary organ, unspecified: Secondary | ICD-10-CM | POA: Diagnosis not present

## 2024-08-03 DIAGNOSIS — N135 Crossing vessel and stricture of ureter without hydronephrosis: Secondary | ICD-10-CM | POA: Diagnosis not present

## 2024-08-03 DIAGNOSIS — C652 Malignant neoplasm of left renal pelvis: Secondary | ICD-10-CM | POA: Diagnosis not present

## 2024-08-03 DIAGNOSIS — C689 Malignant neoplasm of urinary organ, unspecified: Secondary | ICD-10-CM | POA: Diagnosis not present

## 2024-08-03 DIAGNOSIS — R8290 Unspecified abnormal findings in urine: Secondary | ICD-10-CM | POA: Diagnosis not present

## 2024-08-07 DIAGNOSIS — Z936 Other artificial openings of urinary tract status: Secondary | ICD-10-CM | POA: Diagnosis not present

## 2024-08-07 DIAGNOSIS — C689 Malignant neoplasm of urinary organ, unspecified: Secondary | ICD-10-CM | POA: Diagnosis not present

## 2024-08-14 DIAGNOSIS — C689 Malignant neoplasm of urinary organ, unspecified: Secondary | ICD-10-CM | POA: Diagnosis not present

## 2024-08-21 DIAGNOSIS — C689 Malignant neoplasm of urinary organ, unspecified: Secondary | ICD-10-CM | POA: Diagnosis not present

## 2024-08-28 DIAGNOSIS — C689 Malignant neoplasm of urinary organ, unspecified: Secondary | ICD-10-CM | POA: Diagnosis not present

## 2024-08-28 DIAGNOSIS — Z5111 Encounter for antineoplastic chemotherapy: Secondary | ICD-10-CM | POA: Diagnosis not present

## 2024-09-04 DIAGNOSIS — Z5111 Encounter for antineoplastic chemotherapy: Secondary | ICD-10-CM | POA: Diagnosis not present

## 2024-09-04 DIAGNOSIS — C689 Malignant neoplasm of urinary organ, unspecified: Secondary | ICD-10-CM | POA: Diagnosis not present

## 2024-09-07 DIAGNOSIS — I5032 Chronic diastolic (congestive) heart failure: Secondary | ICD-10-CM | POA: Diagnosis not present

## 2024-09-07 DIAGNOSIS — I2510 Atherosclerotic heart disease of native coronary artery without angina pectoris: Secondary | ICD-10-CM | POA: Diagnosis not present

## 2024-09-07 DIAGNOSIS — E785 Hyperlipidemia, unspecified: Secondary | ICD-10-CM | POA: Diagnosis not present

## 2024-09-07 DIAGNOSIS — E119 Type 2 diabetes mellitus without complications: Secondary | ICD-10-CM | POA: Diagnosis not present

## 2024-09-07 DIAGNOSIS — J449 Chronic obstructive pulmonary disease, unspecified: Secondary | ICD-10-CM | POA: Diagnosis not present

## 2024-09-07 DIAGNOSIS — Z7982 Long term (current) use of aspirin: Secondary | ICD-10-CM | POA: Diagnosis not present

## 2024-09-07 DIAGNOSIS — N135 Crossing vessel and stricture of ureter without hydronephrosis: Secondary | ICD-10-CM | POA: Diagnosis not present

## 2024-09-07 DIAGNOSIS — I252 Old myocardial infarction: Secondary | ICD-10-CM | POA: Diagnosis not present

## 2024-09-07 DIAGNOSIS — F17210 Nicotine dependence, cigarettes, uncomplicated: Secondary | ICD-10-CM | POA: Diagnosis not present

## 2024-09-07 DIAGNOSIS — I25110 Atherosclerotic heart disease of native coronary artery with unstable angina pectoris: Secondary | ICD-10-CM | POA: Diagnosis not present

## 2024-09-07 DIAGNOSIS — I509 Heart failure, unspecified: Secondary | ICD-10-CM | POA: Diagnosis not present

## 2024-09-07 DIAGNOSIS — I110 Hypertensive heart disease with heart failure: Secondary | ICD-10-CM | POA: Diagnosis not present

## 2024-09-11 DIAGNOSIS — Z5111 Encounter for antineoplastic chemotherapy: Secondary | ICD-10-CM | POA: Diagnosis not present

## 2024-09-11 DIAGNOSIS — C689 Malignant neoplasm of urinary organ, unspecified: Secondary | ICD-10-CM | POA: Diagnosis not present

## 2024-09-11 DIAGNOSIS — C676 Malignant neoplasm of ureteric orifice: Secondary | ICD-10-CM | POA: Diagnosis not present

## 2024-09-14 DIAGNOSIS — C689 Malignant neoplasm of urinary organ, unspecified: Secondary | ICD-10-CM | POA: Diagnosis not present

## 2024-09-19 DIAGNOSIS — C689 Malignant neoplasm of urinary organ, unspecified: Secondary | ICD-10-CM | POA: Diagnosis not present

## 2024-09-28 DIAGNOSIS — C61 Malignant neoplasm of prostate: Secondary | ICD-10-CM | POA: Diagnosis not present

## 2024-09-28 DIAGNOSIS — I6523 Occlusion and stenosis of bilateral carotid arteries: Secondary | ICD-10-CM | POA: Diagnosis not present

## 2024-09-28 DIAGNOSIS — I10 Essential (primary) hypertension: Secondary | ICD-10-CM | POA: Diagnosis not present

## 2024-09-28 DIAGNOSIS — C659 Malignant neoplasm of unspecified renal pelvis: Secondary | ICD-10-CM | POA: Diagnosis not present

## 2024-09-28 DIAGNOSIS — G8929 Other chronic pain: Secondary | ICD-10-CM | POA: Diagnosis not present

## 2024-09-28 DIAGNOSIS — M5442 Lumbago with sciatica, left side: Secondary | ICD-10-CM | POA: Diagnosis not present

## 2024-09-28 DIAGNOSIS — E785 Hyperlipidemia, unspecified: Secondary | ICD-10-CM | POA: Diagnosis not present

## 2024-09-28 DIAGNOSIS — I2510 Atherosclerotic heart disease of native coronary artery without angina pectoris: Secondary | ICD-10-CM | POA: Diagnosis not present

## 2024-09-28 DIAGNOSIS — E119 Type 2 diabetes mellitus without complications: Secondary | ICD-10-CM | POA: Diagnosis not present

## 2024-10-05 DIAGNOSIS — E785 Hyperlipidemia, unspecified: Secondary | ICD-10-CM | POA: Diagnosis not present

## 2024-10-05 DIAGNOSIS — Z1331 Encounter for screening for depression: Secondary | ICD-10-CM | POA: Diagnosis not present

## 2024-10-05 DIAGNOSIS — I10 Essential (primary) hypertension: Secondary | ICD-10-CM | POA: Diagnosis not present

## 2024-10-05 DIAGNOSIS — E119 Type 2 diabetes mellitus without complications: Secondary | ICD-10-CM | POA: Diagnosis not present

## 2024-10-05 DIAGNOSIS — Z23 Encounter for immunization: Secondary | ICD-10-CM | POA: Diagnosis not present

## 2024-10-12 DIAGNOSIS — C651 Malignant neoplasm of right renal pelvis: Secondary | ICD-10-CM | POA: Diagnosis not present

## 2024-10-12 DIAGNOSIS — C689 Malignant neoplasm of urinary organ, unspecified: Secondary | ICD-10-CM | POA: Diagnosis not present

## 2024-10-15 DIAGNOSIS — C662 Malignant neoplasm of left ureter: Secondary | ICD-10-CM | POA: Diagnosis not present

## 2024-10-15 DIAGNOSIS — C689 Malignant neoplasm of urinary organ, unspecified: Secondary | ICD-10-CM | POA: Diagnosis not present

## 2024-10-30 DIAGNOSIS — C689 Malignant neoplasm of urinary organ, unspecified: Secondary | ICD-10-CM | POA: Diagnosis not present

## 2024-10-30 DIAGNOSIS — I6523 Occlusion and stenosis of bilateral carotid arteries: Secondary | ICD-10-CM | POA: Diagnosis not present

## 2024-10-30 DIAGNOSIS — F17200 Nicotine dependence, unspecified, uncomplicated: Secondary | ICD-10-CM | POA: Diagnosis not present

## 2024-10-30 DIAGNOSIS — J449 Chronic obstructive pulmonary disease, unspecified: Secondary | ICD-10-CM | POA: Diagnosis not present

## 2024-10-30 DIAGNOSIS — E785 Hyperlipidemia, unspecified: Secondary | ICD-10-CM | POA: Diagnosis not present

## 2024-10-30 DIAGNOSIS — I5030 Unspecified diastolic (congestive) heart failure: Secondary | ICD-10-CM | POA: Diagnosis not present

## 2024-10-30 DIAGNOSIS — I2510 Atherosclerotic heart disease of native coronary artery without angina pectoris: Secondary | ICD-10-CM | POA: Diagnosis not present

## 2024-10-30 DIAGNOSIS — I110 Hypertensive heart disease with heart failure: Secondary | ICD-10-CM | POA: Diagnosis not present

## 2024-10-30 DIAGNOSIS — C679 Malignant neoplasm of bladder, unspecified: Secondary | ICD-10-CM | POA: Diagnosis not present

## 2024-10-30 DIAGNOSIS — D090 Carcinoma in situ of bladder: Secondary | ICD-10-CM | POA: Diagnosis not present

## 2024-10-30 DIAGNOSIS — F17210 Nicotine dependence, cigarettes, uncomplicated: Secondary | ICD-10-CM | POA: Diagnosis not present

## 2024-10-30 DIAGNOSIS — N135 Crossing vessel and stricture of ureter without hydronephrosis: Secondary | ICD-10-CM | POA: Diagnosis not present

## 2025-01-15 ENCOUNTER — Telehealth: Payer: Self-pay

## 2025-01-15 NOTE — Telephone Encounter (Signed)
 Auth Submission: NO AUTH NEEDED Site of care: Urology Payer: [employer] medicare Medication & CPT/J Code(s) submitted: Eligard  Diagnosis Code:  Route of submission (phone, fax, portal): portal Phone # Fax # Auth type: Buy/Bill PB Units/visits requested: 45mg  x 2 doses Reference number:  Approval from: 01/15/25 to 12/19/25

## 2025-01-15 NOTE — Telephone Encounter (Signed)
 Called patient to let patient know that eligard  was not approved. Verified insurance with patient on the phone. Cancelled patient appt for tomorrow until its approved.

## 2025-01-16 ENCOUNTER — Ambulatory Visit: Admitting: Urology

## 2025-01-16 ENCOUNTER — Encounter: Payer: Self-pay | Admitting: Urology

## 2025-01-16 VITALS — BP 122/69 | HR 81 | Ht 70.0 in | Wt 172.0 lb

## 2025-01-16 DIAGNOSIS — C61 Malignant neoplasm of prostate: Secondary | ICD-10-CM

## 2025-01-16 MED ORDER — LEUPROLIDE ACETATE (6 MONTH) 45 MG ~~LOC~~ KIT
45.0000 mg | PACK | Freq: Once | SUBCUTANEOUS | Status: AC
  Administered 2025-01-16: 45 mg via SUBCUTANEOUS

## 2025-01-16 NOTE — Progress Notes (Signed)
 "  01/16/2025 1:56 PM   Seth Rivera [birth year] [id number]  Referring provider: Epifanio Alm SQUIBB, MD [address] [place],  [place] [address]  Chief Complaint  Patient presents with   Prostate Cancer    Urologic history: 1.  Prostate cancer High risk prostate cancer diagnosed 05/25/2023: PSA 14.0; prostate MRI PI-RADS 5 lesion left PZ base through apex; path multiple positive cores Gleason 4+4/4+5 Treated IMRT + ADT  2.  High-grade upper tract urothelial carcinoma Incidentally noted on bone scan to have hydronephrosis which on CT showed bilateral hydronephrosis URS with bilateral proximal ureteral tumors showing high-grade urothelial carcinoma Currently managed [facility] treated Jelmyto  and currently lower/upper tract surveillance at [facility]   HPI: Seth Rivera is a 79 y.o. male who dents for 13-month follow-up.  Only complaint is stent related symptoms which he has managed well Scheduled for follow-up cystoscopy/ureteroscopy [facility] May 2026 Due for leuprolide  injection today Last PSA July 2025 undetectable <0.1  PMH: Past Medical History:  Diagnosis Date   Allergic rhinitis    Bilateral carotid artery stenosis    Bladder-neck obstruction    Carotid stenosis    Colon polyp    COPD (chronic obstructive pulmonary disease) (HCC)    Coronary artery disease    Coronary atherosclerosis of native coronary artery    Diabetes mellitus without complication (HCC)    NO medications (patient state borderline diabetes)   Elevated PSA    Gastric ulcer    History of non-ST elevation myocardial infarction (NSTEMI)    Hyperlipidemia    Hypertension    Ischemic finger    Malignant neoplasm of prostate (HCC)    stage ll C   Myocardial infarction [facility])    Personal history of tobacco use, presenting hazards to health 05/20/2016   Pulmonary granuloma (HCC)    Pulmonary granuloma (HCC)    Pure hypercholesterolemia    Restless legs syndrome (RLS)    Squamous cell carcinoma in situ     Thoracic outlet syndrome    Tobacco use disorder    Tubular adenoma of colon    Tubular adenoma of colon    Type 2 diabetes mellitus without complication     Surgical History: Past Surgical History:  Procedure Laterality Date   APPENDECTOMY  1949   CAROTID ENDARTERECTOMY     CATARACT EXTRACTION  2014   COLONOSCOPY  2014   COLONOSCOPY WITH PROPOFOL  N/A 09/06/2016   Procedure: COLONOSCOPY WITH PROPOFOL ;  Surgeon: Lamar ONEIDA Holmes, MD;  Location: [facility] ENDOSCOPY;  Service: Endoscopy;  Laterality: N/A;   COLONOSCOPY WITH PROPOFOL  N/A 02/28/2020   Procedure: COLONOSCOPY WITH PROPOFOL ;  Surgeon: Toledo, Ladell POUR, MD;  Location: [facility] ENDOSCOPY;  Service: Gastroenterology;  Laterality: N/A;   CORONARY ANGIOPLASTY  1999   CYSTOSCOPY W/ RETROGRADES Bilateral 07/26/2023   Procedure: CYSTOSCOPY WITH RETROGRADE PYELOGRAM;  Surgeon: Twylla Glendia BROCKS, MD;  Location: [facility] ORS;  Service: Urology;  Laterality: Bilateral;   CYSTOSCOPY WITH STENT PLACEMENT Bilateral 07/26/2023   Procedure: CYSTOSCOPY WITH STENT PLACEMENT;  Surgeon: Twylla Glendia BROCKS, MD;  Location: [facility] ORS;  Service: Urology;  Laterality: Bilateral;   THORACIC OUTLET SURGERY  07/25/2018   left first rib resection, dissection of subclavian artery, anterior and middle scalenectomy   URETERAL BIOPSY Bilateral 07/26/2023   Procedure: URETERAL BIOPSY;  Surgeon: Twylla Glendia BROCKS, MD;  Location: [facility] ORS;  Service: Urology;  Laterality: Bilateral;   URETEROSCOPY Bilateral 07/26/2023   Procedure: DIAGNOSTIC URETEROSCOPY;  Surgeon: Twylla Glendia BROCKS, MD;  Location: [facility] ORS;  Service: Urology;  Laterality: Bilateral;    Home Medications:  Allergies as of 01/16/2025       Reactions   Penicillins Hives   Has patient had a PCN reaction causing immediate rash, facial/tongue/throat swelling, SOB or lightheadedness with hypotension: Yes Has patient had a PCN reaction causing severe rash involving mucus membranes or skin necrosis: No Has patient had a PCN  reaction that required hospitalization: No Has patient had a PCN reaction occurring within the last 10 years: No If all of the above answers are NO, then may proceed with Cephalosporin use.        Medication List        Accurate as of January 16, 2025  1:56 PM. If you have any questions, ask your nurse or doctor.          STOP taking these medications    predniSONE  50 MG tablet Commonly known as: DELTASONE  Stopped by: Glendia Barba, MD       TAKE these medications    aspirin EC 81 MG tablet Take 81 mg by mouth every morning.   atorvastatin 40 MG tablet Commonly known as: LIPITOR Take 20 mg by mouth every morning.   losartan 100 MG tablet Commonly known as: COZAAR Take 100 mg by mouth every morning.   traZODone 50 MG tablet Commonly known as: DESYREL Take 50 mg by mouth.        Allergies: Allergies[1]  Family History: Family History  Problem Relation Age of Onset   Lymphoma Mother    Lung cancer Father    Heart attack Brother    Diabetes Brother     Social History:  reports that he has been smoking cigarettes. He started smoking about 66 years ago. He has a 33.1 pack-year smoking history. He has never used smokeless tobacco. He reports that he does not drink alcohol and does not use drugs.   Physical Exam: BP 122/69   Pulse 81   Ht 5' 10 (1.778 m)   Wt 172 lb (78 kg)   BMI 24.68 kg/m   Constitutional:  Alert, No acute distress. HEENT: [place] AT Respiratory: Normal respiratory effort, no increased work of breathing. Psychiatric: Normal mood and affect.   Assessment & Plan:    1. Prostate cancer  Last PSA undetectable Received leuprolide  today PSA ordered 68-month follow-up for leuprolide /PSA  2.  Upper tract urothelial carcinoma Continue [facility] follow-up    Glendia JAYSON Barba, MD  [facility] [address] [place], [place] [address] [phone]    [1]  Allergies Allergen Reactions    Penicillins Hives    Has patient had a PCN reaction causing immediate rash, facial/tongue/throat swelling, SOB or lightheadedness with hypotension: Yes Has patient had a PCN reaction causing severe rash involving mucus membranes or skin necrosis: No Has patient had a PCN reaction that required hospitalization: No Has patient had a PCN reaction occurring within the last 10 years: No If all of the above answers are NO, then may proceed with Cephalosporin use.    "

## 2025-01-16 NOTE — Progress Notes (Signed)
 Eligard  SubQ Injection   Due to Prostate Cancer patient is present today for a Eligard  Injection.  Medication: Eligard  6 month Dose: 45 mg  Location: right  Lot: [id number] Exp: 06/2025  Patient tolerated well, no complications were noted  Performed by: Harlene Franks CMA  Per Dr. Twylla  patient is to continue therapy for 6 months . Patient's next follow up was scheduled for 07/16/2025. This appointment was scheduled using wheel and given to patient today along with reminder continue on Vitamin D 800-1000iu and Calcium 1000-1200mg  daily while on Androgen Deprivation Therapy.  PA approval dates:   01/15/25 to 12/19/25

## 2025-01-17 ENCOUNTER — Ambulatory Visit: Payer: Self-pay | Admitting: Urology

## 2025-01-17 ENCOUNTER — Ambulatory Visit: Admitting: Physician Assistant

## 2025-01-17 LAB — PSA: Prostate Specific Ag, Serum: <0.1 ng/mL (ref 0.0–4.0)

## 2025-01-23 ENCOUNTER — Encounter: Payer: Self-pay | Admitting: Nurse Practitioner

## 2025-01-23 ENCOUNTER — Ambulatory Visit

## 2025-01-23 VITALS — BP 122/58 | HR 67 | Ht 70.0 in | Wt 178.2 lb

## 2025-01-23 DIAGNOSIS — I6523 Occlusion and stenosis of bilateral carotid arteries: Secondary | ICD-10-CM

## 2025-01-23 DIAGNOSIS — I10 Essential (primary) hypertension: Secondary | ICD-10-CM

## 2025-01-23 DIAGNOSIS — I2510 Atherosclerotic heart disease of native coronary artery without angina pectoris: Secondary | ICD-10-CM

## 2025-01-23 DIAGNOSIS — J449 Chronic obstructive pulmonary disease, unspecified: Secondary | ICD-10-CM | POA: Diagnosis not present

## 2025-01-23 DIAGNOSIS — E119 Type 2 diabetes mellitus without complications: Secondary | ICD-10-CM

## 2025-01-23 DIAGNOSIS — F17200 Nicotine dependence, unspecified, uncomplicated: Secondary | ICD-10-CM

## 2025-01-23 NOTE — Patient Instructions (Signed)
 Medication Instructions:  No changes *If you need a refill on your cardiac medications before your next appointment, please call your pharmacy*  Lab Work: Your provider would like for you to return in a few weeks to have the following labs drawn: Fasting Lipid.   Please go to [facility] [address] ([facility] [address], [place] [address] You do not need an appointment.  They are open from 8 am- 4:30 pm.  Lunch from 1:00 pm- 2:00 pm You will need to be fasting.   You may also go to one of the following [facility]:  [address] [place], [place] [address] Phone: [phone] Lab hours: Mon-Fri 8 am- 5 pm    Lunch 12 pm- 1 pm  [address] [place],  [place]  [address]  [place]  Phone: [phone] Lab hours: 7 am- 4 pm Lunch 12 pm-1 pm   [address] [place],  [place]  [address]  [place]  Phone: [phone] Lab hours: Mon-Fri 8 am- 5 pm    Lunch 12 pm- 1 pm  If you have labs (blood work) drawn today and your tests are completely normal, you will receive your results only by: MyChart Message (if you have MyChart) OR A paper copy in the mail If you have any lab test that is abnormal or we need to change your treatment, we will call you to review the results.  Testing/Procedures: None ordered  Follow-Up: At [facility], you and your health needs are our priority.  As part of our continuing mission to provide you with exceptional heart care, our providers are all part of one team.  This team includes your primary Cardiologist (physician) and Advanced Practice Providers or APPs (Physician Assistants and Nurse Practitioners) who all work together to provide you with the care you need, when you need it.  Your next appointment:   12 month(s)  Provider:   Redell Cave, MD or Lonni Meager, NP    We recommend signing up for the patient portal called MyChart.  Sign up information is provided on this After Visit Summary.  MyChart is used to  connect with patients for Virtual Visits (Telemedicine).  Patients are able to view lab/test results, encounter notes, upcoming appointments, etc.  Non-urgent messages can be sent to your provider as well.   To learn more about what you can do with MyChart, go to [email or web address].

## 2025-02-22 ENCOUNTER — Ambulatory Visit: Admitting: Urology

## 2025-07-16 ENCOUNTER — Ambulatory Visit: Admitting: Physician Assistant
